# Patient Record
Sex: Female | Born: 1955 | Hispanic: Yes | Marital: Married | Smoking: Never smoker
Health system: Southern US, Community
[De-identification: ages and names within clinical notes are randomized; demographics above are authoritative.]

## PROBLEM LIST (undated history)

## (undated) DIAGNOSIS — K635 Polyp of colon: Secondary | ICD-10-CM

## (undated) DIAGNOSIS — E119 Type 2 diabetes mellitus without complications: Secondary | ICD-10-CM

## (undated) DIAGNOSIS — E785 Hyperlipidemia, unspecified: Secondary | ICD-10-CM

## (undated) DIAGNOSIS — R519 Headache, unspecified: Secondary | ICD-10-CM

## (undated) DIAGNOSIS — F329 Major depressive disorder, single episode, unspecified: Secondary | ICD-10-CM

## (undated) DIAGNOSIS — G4700 Insomnia, unspecified: Secondary | ICD-10-CM

## (undated) DIAGNOSIS — I10 Essential (primary) hypertension: Secondary | ICD-10-CM

## (undated) DIAGNOSIS — K5790 Diverticulosis of intestine, part unspecified, without perforation or abscess without bleeding: Secondary | ICD-10-CM

## (undated) DIAGNOSIS — F32A Depression, unspecified: Secondary | ICD-10-CM

## (undated) HISTORY — PX: TUBAL LIGATION: SHX77

## (undated) HISTORY — PX: ESOPHAGOGASTRODUODENOSCOPY: SHX1529

## (undated) HISTORY — PX: ABDOMINAL SURGERY: SHX537

## (undated) HISTORY — PX: OTHER SURGICAL HISTORY: SHX169

## (undated) HISTORY — PX: COLONOSCOPY: SHX174

---

## 2013-04-07 ENCOUNTER — Ambulatory Visit: Payer: Self-pay | Admitting: Family Medicine

## 2013-04-12 ENCOUNTER — Emergency Department: Payer: Self-pay | Admitting: Internal Medicine

## 2013-04-12 LAB — URINALYSIS, COMPLETE
Bacteria: NONE SEEN
Bilirubin,UR: NEGATIVE
Leukocyte Esterase: NEGATIVE
Nitrite: NEGATIVE
Ph: 8 (ref 4.5–8.0)
Squamous Epithelial: 2
WBC UR: 1 /HPF (ref 0–5)

## 2013-04-12 LAB — COMPREHENSIVE METABOLIC PANEL
Albumin: 3.7 g/dL (ref 3.4–5.0)
Anion Gap: 4 — ABNORMAL LOW (ref 7–16)
Bilirubin,Total: 0.4 mg/dL (ref 0.2–1.0)
Calcium, Total: 8.6 mg/dL (ref 8.5–10.1)
Chloride: 108 mmol/L — ABNORMAL HIGH (ref 98–107)
Creatinine: 0.63 mg/dL (ref 0.60–1.30)
EGFR (African American): >60
Glucose: 100 mg/dL — ABNORMAL HIGH (ref 65–99)
Potassium: 4.2 mmol/L (ref 3.5–5.1)
SGOT(AST): 34 U/L (ref 15–37)
SGPT (ALT): 34 U/L (ref 12–78)
Sodium: 140 mmol/L (ref 136–145)

## 2013-04-12 LAB — CBC
MCH: 28.5 pg (ref 26.0–34.0)
MCHC: 33.5 g/dL (ref 32.0–36.0)
MCV: 85 fL (ref 80–100)
RDW: 13.9 % (ref 11.5–14.5)
WBC: 7.8 10*3/uL (ref 3.6–11.0)

## 2013-04-12 LAB — LIPASE, BLOOD: Lipase: 154 U/L (ref 73–393)

## 2013-05-29 ENCOUNTER — Ambulatory Visit: Payer: Self-pay | Admitting: Gastroenterology

## 2013-06-08 ENCOUNTER — Ambulatory Visit: Payer: Self-pay | Admitting: Gastroenterology

## 2013-06-15 ENCOUNTER — Ambulatory Visit: Payer: Self-pay | Admitting: Gastroenterology

## 2013-06-16 LAB — PATHOLOGY REPORT

## 2013-10-11 ENCOUNTER — Emergency Department: Payer: Self-pay | Admitting: Emergency Medicine

## 2013-10-12 LAB — URINALYSIS, COMPLETE
Bacteria: NONE SEEN
Bilirubin,UR: NEGATIVE
GLUCOSE, UR: NEGATIVE mg/dL (ref 0–75)
Ketone: NEGATIVE
LEUKOCYTE ESTERASE: NEGATIVE
Nitrite: NEGATIVE
PH: 5 (ref 4.5–8.0)
Protein: 30
RBC,UR: 2 /HPF (ref 0–5)
SPECIFIC GRAVITY: 1.025 (ref 1.003–1.030)
Squamous Epithelial: 6
WBC UR: 1 /HPF (ref 0–5)

## 2013-10-12 LAB — CBC WITH DIFFERENTIAL/PLATELET
BASOS PCT: 0.3 %
Basophil #: 0 10*3/uL (ref 0.0–0.1)
EOS PCT: 2.3 %
Eosinophil #: 0.3 10*3/uL (ref 0.0–0.7)
HCT: 46 % (ref 35.0–47.0)
HGB: 15.2 g/dL (ref 12.0–16.0)
Lymphocyte #: 0.9 10*3/uL — ABNORMAL LOW (ref 1.0–3.6)
Lymphocyte %: 7.7 %
MCH: 28.3 pg (ref 26.0–34.0)
MCHC: 33.1 g/dL (ref 32.0–36.0)
MCV: 86 fL (ref 80–100)
MONOS PCT: 5.1 %
Monocyte #: 0.6 x10 3/mm (ref 0.2–0.9)
NEUTROS PCT: 84.6 %
Neutrophil #: 10.1 10*3/uL — ABNORMAL HIGH (ref 1.4–6.5)
Platelet: 236 10*3/uL (ref 150–440)
RBC: 5.36 10*6/uL — ABNORMAL HIGH (ref 3.80–5.20)
RDW: 13.5 % (ref 11.5–14.5)
WBC: 11.9 10*3/uL — AB (ref 3.6–11.0)

## 2013-10-12 LAB — COMPREHENSIVE METABOLIC PANEL
ALBUMIN: 4.3 g/dL (ref 3.4–5.0)
ALT: 74 U/L (ref 12–78)
Alkaline Phosphatase: 96 U/L
Anion Gap: 8 (ref 7–16)
BILIRUBIN TOTAL: 0.7 mg/dL (ref 0.2–1.0)
BUN: 11 mg/dL (ref 7–18)
CHLORIDE: 106 mmol/L (ref 98–107)
CREATININE: 0.7 mg/dL (ref 0.60–1.30)
Calcium, Total: 9.1 mg/dL (ref 8.5–10.1)
Co2: 22 mmol/L (ref 21–32)
GLUCOSE: 119 mg/dL — AB (ref 65–99)
Osmolality: 272 (ref 275–301)
Potassium: 4.2 mmol/L (ref 3.5–5.1)
SGOT(AST): 48 U/L — ABNORMAL HIGH (ref 15–37)
SODIUM: 136 mmol/L (ref 136–145)
TOTAL PROTEIN: 9 g/dL — AB (ref 6.4–8.2)

## 2013-10-12 LAB — TROPONIN I: Troponin-I: <0.02 ng/mL

## 2013-10-12 LAB — LIPASE, BLOOD: LIPASE: 158 U/L (ref 73–393)

## 2013-11-24 ENCOUNTER — Ambulatory Visit: Payer: Self-pay | Admitting: Surgery

## 2014-02-12 ENCOUNTER — Emergency Department: Payer: Self-pay | Admitting: Emergency Medicine

## 2014-02-12 LAB — COMPREHENSIVE METABOLIC PANEL
ALT: 30 U/L (ref 12–78)
ANION GAP: 5 — AB (ref 7–16)
Albumin: 3.4 g/dL (ref 3.4–5.0)
Alkaline Phosphatase: 104 U/L
BUN: 8 mg/dL (ref 7–18)
Bilirubin,Total: 0.3 mg/dL (ref 0.2–1.0)
CALCIUM: 8.1 mg/dL — AB (ref 8.5–10.1)
CHLORIDE: 109 mmol/L — AB (ref 98–107)
CO2: 24 mmol/L (ref 21–32)
CREATININE: 1 mg/dL (ref 0.60–1.30)
EGFR (African American): >60
EGFR (Non-African Amer.): >60
GLUCOSE: 93 mg/dL (ref 65–99)
OSMOLALITY: 274 (ref 275–301)
Potassium: 3.6 mmol/L (ref 3.5–5.1)
SGOT(AST): 34 U/L (ref 15–37)
Sodium: 138 mmol/L (ref 136–145)
Total Protein: 7.5 g/dL (ref 6.4–8.2)

## 2014-02-12 LAB — URINALYSIS, COMPLETE
Bilirubin,UR: NEGATIVE
GLUCOSE, UR: NEGATIVE mg/dL (ref 0–75)
KETONE: NEGATIVE
Leukocyte Esterase: NEGATIVE
Nitrite: NEGATIVE
PH: 6 (ref 4.5–8.0)
Protein: NEGATIVE
SPECIFIC GRAVITY: 1.012 (ref 1.003–1.030)
Squamous Epithelial: <1
WBC UR: 1 /HPF (ref 0–5)

## 2014-02-12 LAB — CBC WITH DIFFERENTIAL/PLATELET
BASOS ABS: 0.1 10*3/uL (ref 0.0–0.1)
BASOS PCT: 0.8 %
Eosinophil #: 0.3 10*3/uL (ref 0.0–0.7)
Eosinophil %: 3.6 %
HCT: 37.7 % (ref 35.0–47.0)
HGB: 12 g/dL (ref 12.0–16.0)
LYMPHS ABS: 2.4 10*3/uL (ref 1.0–3.6)
LYMPHS PCT: 26.8 %
MCH: 27.8 pg (ref 26.0–34.0)
MCHC: 32 g/dL (ref 32.0–36.0)
MCV: 87 fL (ref 80–100)
MONOS PCT: 12.5 %
Monocyte #: 1.1 x10 3/mm — ABNORMAL HIGH (ref 0.2–0.9)
NEUTROS PCT: 56.3 %
Neutrophil #: 5.1 10*3/uL (ref 1.4–6.5)
Platelet: 305 10*3/uL (ref 150–440)
RBC: 4.33 10*6/uL (ref 3.80–5.20)
RDW: 14 % (ref 11.5–14.5)
WBC: 9 10*3/uL (ref 3.6–11.0)

## 2014-02-12 LAB — D-DIMER(ARMC): D-Dimer: 475 ng/ml

## 2014-02-12 LAB — LIPASE, BLOOD: LIPASE: 305 U/L (ref 73–393)

## 2014-03-12 ENCOUNTER — Emergency Department: Payer: Self-pay | Admitting: Emergency Medicine

## 2014-03-12 LAB — URINALYSIS, COMPLETE
BILIRUBIN, UR: NEGATIVE
Bacteria: NONE SEEN
GLUCOSE, UR: NEGATIVE mg/dL (ref 0–75)
Ketone: NEGATIVE
Leukocyte Esterase: NEGATIVE
NITRITE: NEGATIVE
PH: 5 (ref 4.5–8.0)
Protein: NEGATIVE
RBC,UR: 1 /HPF (ref 0–5)
Specific Gravity: 1.011 (ref 1.003–1.030)
WBC UR: 1 /HPF (ref 0–5)

## 2014-03-12 LAB — COMPREHENSIVE METABOLIC PANEL
ALK PHOS: 107 U/L
ALT: 38 U/L
ANION GAP: 8 (ref 7–16)
AST: 38 U/L — AB (ref 15–37)
Albumin: 3.6 g/dL (ref 3.4–5.0)
BUN: 11 mg/dL (ref 7–18)
Bilirubin,Total: 0.3 mg/dL (ref 0.2–1.0)
CALCIUM: 8.2 mg/dL — AB (ref 8.5–10.1)
CREATININE: 1.05 mg/dL (ref 0.60–1.30)
Chloride: 107 mmol/L (ref 98–107)
Co2: 27 mmol/L (ref 21–32)
EGFR (African American): >60
EGFR (Non-African Amer.): 59 — ABNORMAL LOW
Glucose: 138 mg/dL — ABNORMAL HIGH (ref 65–99)
OSMOLALITY: 285 (ref 275–301)
Potassium: 4 mmol/L (ref 3.5–5.1)
Sodium: 142 mmol/L (ref 136–145)
TOTAL PROTEIN: 7.5 g/dL (ref 6.4–8.2)

## 2014-03-12 LAB — CBC WITH DIFFERENTIAL/PLATELET
BASOS ABS: 0.1 10*3/uL (ref 0.0–0.1)
Basophil %: 1.3 %
Eosinophil #: 0.4 10*3/uL (ref 0.0–0.7)
Eosinophil %: 4.8 %
HCT: 38.8 % (ref 35.0–47.0)
HGB: 12.6 g/dL (ref 12.0–16.0)
LYMPHS ABS: 2.6 10*3/uL (ref 1.0–3.6)
Lymphocyte %: 33.4 %
MCH: 28.2 pg (ref 26.0–34.0)
MCHC: 32.4 g/dL (ref 32.0–36.0)
MCV: 87 fL (ref 80–100)
MONOS PCT: 8.8 %
Monocyte #: 0.7 x10 3/mm (ref 0.2–0.9)
NEUTROS PCT: 51.7 %
Neutrophil #: 4.1 10*3/uL (ref 1.4–6.5)
Platelet: 259 10*3/uL (ref 150–440)
RBC: 4.46 10*6/uL (ref 3.80–5.20)
RDW: 13.6 % (ref 11.5–14.5)
WBC: 7.9 10*3/uL (ref 3.6–11.0)

## 2014-03-12 LAB — LIPASE, BLOOD: Lipase: 253 U/L (ref 73–393)

## 2014-04-16 ENCOUNTER — Ambulatory Visit: Payer: Self-pay | Admitting: Physician Assistant

## 2014-11-12 ENCOUNTER — Other Ambulatory Visit: Payer: Self-pay | Admitting: Family Medicine

## 2014-11-12 DIAGNOSIS — Z1231 Encounter for screening mammogram for malignant neoplasm of breast: Secondary | ICD-10-CM

## 2014-12-04 ENCOUNTER — Other Ambulatory Visit: Payer: Self-pay

## 2014-12-04 DIAGNOSIS — R509 Fever, unspecified: Secondary | ICD-10-CM | POA: Insufficient documentation

## 2014-12-04 DIAGNOSIS — R112 Nausea with vomiting, unspecified: Secondary | ICD-10-CM | POA: Diagnosis not present

## 2014-12-04 DIAGNOSIS — E119 Type 2 diabetes mellitus without complications: Secondary | ICD-10-CM | POA: Insufficient documentation

## 2014-12-04 DIAGNOSIS — R1084 Generalized abdominal pain: Secondary | ICD-10-CM | POA: Insufficient documentation

## 2014-12-04 DIAGNOSIS — M791 Myalgia: Secondary | ICD-10-CM | POA: Insufficient documentation

## 2014-12-04 DIAGNOSIS — R51 Headache: Secondary | ICD-10-CM | POA: Insufficient documentation

## 2014-12-04 DIAGNOSIS — R197 Diarrhea, unspecified: Secondary | ICD-10-CM | POA: Insufficient documentation

## 2014-12-04 DIAGNOSIS — I10 Essential (primary) hypertension: Secondary | ICD-10-CM | POA: Diagnosis not present

## 2014-12-04 LAB — CBC WITH DIFFERENTIAL/PLATELET
BASOS ABS: 0.1 10*3/uL (ref 0–0.1)
BASOS PCT: 1 %
Eosinophils Absolute: 0 10*3/uL (ref 0–0.7)
Eosinophils Relative: 0 %
HEMATOCRIT: 43.6 % (ref 35.0–47.0)
HEMOGLOBIN: 14.5 g/dL (ref 12.0–16.0)
LYMPHS ABS: 1.5 10*3/uL (ref 1.0–3.6)
Lymphocytes Relative: 13 %
MCH: 27.9 pg (ref 26.0–34.0)
MCHC: 33.3 g/dL (ref 32.0–36.0)
MCV: 83.6 fL (ref 80.0–100.0)
MONOS PCT: 7 %
Monocytes Absolute: 0.8 10*3/uL (ref 0.2–0.9)
NEUTROS PCT: 79 %
Neutro Abs: 8.5 10*3/uL — ABNORMAL HIGH (ref 1.4–6.5)
Platelets: 225 10*3/uL (ref 150–440)
RBC: 5.22 MIL/uL — AB (ref 3.80–5.20)
RDW: 14 % (ref 11.5–14.5)
WBC: 10.8 10*3/uL (ref 3.6–11.0)

## 2014-12-04 LAB — COMPREHENSIVE METABOLIC PANEL
ALBUMIN: 4.5 g/dL (ref 3.5–5.0)
ALT: 43 U/L (ref 14–54)
AST: 41 U/L (ref 15–41)
Alkaline Phosphatase: 77 U/L (ref 38–126)
Anion gap: 10 (ref 5–15)
BUN: 6 mg/dL (ref 6–20)
CHLORIDE: 101 mmol/L (ref 101–111)
CO2: 22 mmol/L (ref 22–32)
CREATININE: 0.65 mg/dL (ref 0.44–1.00)
Calcium: 8.9 mg/dL (ref 8.9–10.3)
GFR calc non Af Amer: >60 mL/min (ref >60)
Glucose, Bld: 120 mg/dL — ABNORMAL HIGH (ref 65–99)
Potassium: 3.4 mmol/L — ABNORMAL LOW (ref 3.5–5.1)
Sodium: 133 mmol/L — ABNORMAL LOW (ref 135–145)
Total Bilirubin: 0.5 mg/dL (ref 0.3–1.2)
Total Protein: 8.3 g/dL — ABNORMAL HIGH (ref 6.5–8.1)

## 2014-12-04 LAB — TROPONIN I: Troponin I: <0.03 ng/mL (ref <0.031)

## 2014-12-04 MED ORDER — ONDANSETRON 8 MG PO TBDP
ORAL_TABLET | ORAL | Status: AC
  Filled 2014-12-04: qty 1

## 2014-12-04 MED ORDER — ONDANSETRON 8 MG PO TBDP
8.0000 mg | ORAL_TABLET | Freq: Once | ORAL | Status: AC
  Administered 2014-12-04: 8 mg via ORAL

## 2014-12-04 NOTE — ED Notes (Signed)
Pt states 2 days of nausea, vomiting, diarrhea, headache, generalized body aches, fever, abd pain.

## 2014-12-05 ENCOUNTER — Emergency Department
Admission: EM | Admit: 2014-12-05 | Discharge: 2014-12-05 | Disposition: A | Discharge status: Home / Self Care | Payer: Medicare Other | Attending: Emergency Medicine | Admitting: Emergency Medicine

## 2014-12-05 ENCOUNTER — Encounter: Payer: Self-pay | Admitting: Emergency Medicine

## 2014-12-05 DIAGNOSIS — R1084 Generalized abdominal pain: Secondary | ICD-10-CM

## 2014-12-05 DIAGNOSIS — R197 Diarrhea, unspecified: Secondary | ICD-10-CM

## 2014-12-05 DIAGNOSIS — R112 Nausea with vomiting, unspecified: Secondary | ICD-10-CM

## 2014-12-05 HISTORY — DX: Type 2 diabetes mellitus without complications: E11.9

## 2014-12-05 HISTORY — DX: Essential (primary) hypertension: I10

## 2014-12-05 LAB — URINALYSIS COMPLETE WITH MICROSCOPIC (ARMC ONLY)
Bacteria, UA: NONE SEEN
Bilirubin Urine: NEGATIVE
Glucose, UA: NEGATIVE mg/dL
Ketones, ur: NEGATIVE mg/dL
LEUKOCYTES UA: NEGATIVE
NITRITE: NEGATIVE
PH: 6 (ref 5.0–8.0)
Protein, ur: NEGATIVE mg/dL
Specific Gravity, Urine: 1.003 — ABNORMAL LOW (ref 1.005–1.030)

## 2014-12-05 MED ORDER — ACETAMINOPHEN 325 MG PO TABS
ORAL_TABLET | ORAL | Status: AC
  Administered 2014-12-05: 650 mg
  Filled 2014-12-05: qty 2

## 2014-12-05 MED ORDER — ONDANSETRON HCL 4 MG PO TABS: 4.0000 mg | ORAL_TABLET | Freq: Three times a day (TID) | ORAL | Status: AC | PRN

## 2014-12-05 MED ORDER — SODIUM CHLORIDE 0.9 % IV BOLUS (SEPSIS)
1000.0000 mL | INTRAVENOUS | Status: AC
Start: 2014-12-05 — End: 2014-12-05
  Administered 2014-12-05: 1000 mL via INTRAVENOUS

## 2014-12-05 MED ORDER — ONDANSETRON HCL 4 MG/2ML IJ SOLN
4.0000 mg | INTRAMUSCULAR | Status: AC
  Administered 2014-12-05: 4 mg via INTRAVENOUS

## 2014-12-05 MED ORDER — ONDANSETRON HCL 4 MG/2ML IJ SOLN
INTRAMUSCULAR | Status: AC
  Filled 2014-12-05: qty 2

## 2014-12-05 NOTE — Discharge Instructions (Signed)
Dolor abdominal (Abdominal Pain) El dolor puede tener muchas causas. Normalmente la causa del dolor abdominal no es una enfermedad y Teacher, English as a foreign language sin Clinical research associate. Frecuentemente puede controlarse y tratarse en casa. Su mdico le Chartered certified accountant examen fsico y posiblemente solicite anlisis de sangre y radiografas para ayudar a Teacher, adult education la gravedad de su dolor. Sin embargo, en [employer], debe transcurrir ms tiempo antes de que se pueda Pension scheme manager una causa evidente del dolor. Antes de llegar a ese punto, es posible que su mdico no sepa si necesita ms pruebas o un tratamiento ms profundo. INSTRUCCIONES PARA EL CUIDADO EN EL HOGAR  Est atento al dolor para ver si hay cambios. Las siguientes indicaciones ayudarn a Chief Strategy Officer que pueda sentir:  [place] solo medicamentos de venta libre o recetados, segn las indicaciones del mdico.  No tome laxantes a menos que se lo haya indicado su mdico.  Pruebe con Ardelia Mems dieta lquida absoluta (caldo, t o agua) segn se lo indique su mdico. Introduzca gradualmente una dieta normal, segn su tolerancia. SOLICITE ATENCIN MDICA SI:  Tiene dolor abdominal sin explicacin.  Tiene dolor abdominal relacionado con nuseas o diarrea.  Tiene dolor cuando orina o defeca.  Experimenta dolor abdominal que lo despierta de noche.  Tiene dolor abdominal que empeora o mejora cuando come alimentos.  Tiene dolor abdominal que empeora cuando come alimentos grasosos.  Tiene fiebre. SOLICITE ATENCIN MDICA DE INMEDIATO SI:   El dolor no desaparece en un plazo mximo de 2horas.  No deja de (vomitar).  El Social research officer, government se siente solo en partes del abdomen, como el lado derecho o la parte inferior izquierda del abdomen.  Evaca materia fecal sanguinolenta o negra, de aspecto alquitranado. ASEGRESE DE QUE:  Comprende estas instrucciones.  Controlar su afeccin.  Recibir ayuda de inmediato si no mejora o si empeora. Document Released: 07/16/2005  Document Revised: 07/21/2013 [facility] Patient Information 2015 [place]. This information is not intended to replace advice given to you by your health care provider. Make sure you discuss any questions you have with your health care provider.  Nuseas y vmitos (Nausea and Vomiting)  Naseas significa que tiene ganas de vomitar. Elvmitoes un reflejo por el que los contenidos del estmago salen por la boca.  CUIDADOS EN EL HOGAR  Tome los medicamentos como le indic el mdico.  No se esfuerce por comer. Sin embargo, es necesario que tome lquidos.  Si tiene ganas de comer, [place] dieta normal, segn las indicaciones del mdico.  Coma arroz, trigo, patatas, pan, carnes magras, yogur, frutas y vegetales.  Evite los alimentos [employer].  Beba gran cantidad de lquidos para mantener la orina de tono claro o color amarillo plido.  Consulte a su mdico como reponer la prdida de lquidos (rehidratacin). Los signos de prdida de lquidos (deshidratacin) son:  Catering manager sed.  Tiene los labios y la boca secos.  Est mareado.  La orina es [place].  Orina menos que lo normal.  Se siente confundido.  La respiracin o la frecuencia cardaca son rpidas. SOLICITE AYUDA DE INMEDIATO SI:  Observa sangre en su vmito.  La materia fecal (heces)es negra o de color rojo.  Siente un dolor de cabeza muy intenso o el cuello rgido.  Se siente confundido.  Siente dolor muy fuerte en el vientre (abdominal).  Tiene dolor en el pecho o dificultad para respirar.  No ha orinado durante 8 horas.  Tiene la piel fra y pegajosa.  Sigue vomitando despus de 24  48 horas.  Tiene  fiebre. ASEGRESE QUE:  Comprende estas instrucciones.  Controlar la enfermedad.  Puede solicitar ayuda de inmediato si los sntomas no mejoran, o si empeoran. Document Released: 01/03/2010 Document Revised: 10/08/2011 [facility] Patient Information 2015 [place]. This information is  not intended to replace advice given to you by your health care provider. Make sure you discuss any questions you have with your health care provider.  Diarrea  (Diarrhea) La diarrea es la materia fecal (heces) acuosas. Puede hacerlo sentir dbil, cansado, sediento, o con la boca seca (signos de deshidratacin). La materia fecal acuosa es signo de otro problema, generalmente una infeccin. Suele durar 2 o 3 das. Puede durar ms tiempo si es el signo de una enfermedad grave. Cudese segn lo que le indique su mdico.  CUIDADOS EN EL HOGAR   Beba 1 taza (8 onzas) de lquido cada vez que tenga una deposicin acuosa.  No beba los siguientes lquidos:  Los que contengan azcares simples (fructosa, glucosa, galactosa, [place], sacarosa, maltosa).  Bebidas deportivas.  Jugos de fruta.  Productos lcteos enteros.  Gaseosas.  Bebidas con cafena (caf, t, gaseosas) o alcohol.  Puede usar soluciones de rehidratacin oral si su mdico lo autoriza. Usted mismo puede preparar la solucin. Siga esta receta:   -  cucharadita de sal.   cucharadita de bicarbonato de soda.   de cucharadita de sal sustituta que contenga cloruro de potasio.  1  cucharada de azcar.  1 litros (34 oz) de agua.  Evite los siguientes alimentos:  Los que tienen gran contenido de [place], como frutas y verduras.  Frutas secas, semillas y panes y cereales integrales.   Las endulzadas con alcohol de azcar (xylitol, sorbitol, manitol).  Trate de comer los siguientes alimentos:  Los que tienen almidn, como arroz, pan, pasta, cereales bajos en azcar, avena, smola de maz, papas al horno, galletas y panecillos.  Bananas.  Pur de [employer].  Consuma alimentos ricos en probiticos, como yogur y productos lcteos fermentados.  Lvese bien las manos cada vez que tenga una deposicin acuosa.  Slo tome los medicamentos que le haya indicado su mdico.  Tome un bao caliente para ayudar a Transport planner ardor o  dolor que producen las deposiciones aguadas. SOLICITE AYUDA DE INMEDIATO SI:   No puede beber lquidos sin devolver vomitar.  Sigue vomitando.  Tiene sangre en la materia fecal, o es de color negro y de aspecto alquitranado.  No hay emisin de [place] durante 6 a 8 horas o elimina una pequea cantidad de [place].  Usted tiene dolor de estmago (abdominal) que empeora o se mantiene en el mismo lugar (localiza).  Se siente dbil, mareado, confundido o se desmaya.  Siente un dolor de cabeza muy intenso.  La materia fecal acuosa no mejora.  Tiene fiebre o sntomas que persisten durante ms de 2-3 das.  Tiene fiebre y los sntomas empeoran de manera sbita. ASEGRESE DE QUE:   Comprende estas instrucciones.  Controlar su enfermedad.  Solicitar ayuda de inmediato si no mejora o si empeora. Document Released: 07/02/2012 Document Revised: 11/30/2013 [facility] Patient Information 2015 [place]. This information is not intended to replace advice given to you by your health care provider. Make sure you discuss any questions you have with your health care provider.

## 2014-12-05 NOTE — ED Notes (Signed)
MD at bedside. 

## 2014-12-05 NOTE — ED Provider Notes (Signed)
[facility] Emergency Department Provider Note  ____________________________________________  Time seen: Approximately 2:29 AM  I have reviewed the triage vital signs and the nursing notes.   HISTORY  Chief Complaint Emesis; Diarrhea; Abdominal Pain; Fever; Headache; and Generalized Body Aches  The patient speaks Spanish. I offered these to a hospital interpreter, but the patient prefers to speak Spanish directly with me. I made it clear to her that she has the right to have an interpreter at any point.  HPI Patricia Hamilton is a 59 y.o. female with a history of diabetes but does not take medication who presents with several days of persistent nausea, vomiting, diarrhea, and headache. She is also had some subjective fever and generalized body aches, but the vomiting and diarrhea are her primary complaints. The symptoms are intermittent but occur multiple times each day. She last vomited while in the emergency department. She has vague mild to moderate cramping abdominal pain at times. She has not recently started on any new medications and says that no one else in her family is sick. Nothing makes it better and nothing makes it worse.   Past Medical History  Diagnosis Date  . Diabetes mellitus without complication     no meds  . Hypertension     There are no active problems to display for this patient.   History reviewed. No pertinent past surgical history.  No current outpatient prescriptions on file.  Allergies Review of patient's allergies indicates no known allergies.  History reviewed. No pertinent family history.  Social History History  Substance Use Topics  . Smoking status: Never Smoker   . Smokeless tobacco: Not on file  . Alcohol Use: No    Review of Systems Constitutional: Subjective low-grade fever Eyes: No visual changes. ENT: No sore throat. Cardiovascular: Denies chest pain. Respiratory: Denies shortness of  breath. Gastrointestinal: Abdominal pain and nausea/vomiting/diarrhea as described above. Genitourinary: Negative for dysuria. Musculoskeletal: Negative for back pain. Skin: Negative for rash. Neurological: Negative for headaches, focal weakness or numbness.  10-point ROS otherwise negative.  ____________________________________________   PHYSICAL EXAM:  VITAL SIGNS: ED Triage Vitals  Enc Vitals Group     BP 12/04/14 2109 117/66 mmHg     Pulse Rate 12/04/14 2109 98     Resp 12/05/14 0211 15     Temp 12/04/14 2109 100.4 F (38 C)     Temp Source 12/04/14 2109 Oral     SpO2 12/04/14 2109 99 %     Weight 12/04/14 2109 170 lb (77.111 kg)     Height 12/04/14 2109 5\' 4"  (1.626 m)     Head Cir --      Peak Flow --      Pain Score 12/04/14 2110 9     Pain Loc --      Pain Edu? --      Excl. in [place]? --     Constitutional: Alert and oriented. Well appearing and in no acute distress, though she appears uncomfortable. Eyes: Conjunctivae are normal. PERRL. EOMI. Head: Atraumatic. Nose: No congestion/rhinnorhea. Mouth/Throat: Mucous membranes are moist.  Oropharynx non-erythematous. Neck: No stridor.   Cardiovascular: Normal rate, regular rhythm. Grossly normal heart sounds.  Good peripheral circulation. Respiratory: Normal respiratory effort.  No retractions. Lungs CTAB. Gastrointestinal: Soft with mild generalized nonfocal tenderness. No distention. No abdominal bruits. No CVA tenderness. Musculoskeletal: No lower extremity tenderness nor edema.  No joint effusions. Neurologic:  Normal speech and language. No gross focal neurologic deficits are appreciated. Speech  is normal. No gait instability. Skin:  Skin is warm, dry and intact. No rash noted. Psychiatric: Mood and affect are normal. Speech and behavior are normal.  ____________________________________________   LABS (all labs ordered are listed, but only abnormal results are displayed)  Labs Reviewed  CBC WITH  DIFFERENTIAL/PLATELET - Abnormal; Notable for the following:    RBC 5.22 (*)    Neutro Abs 8.5 (*)    All other components within normal limits  COMPREHENSIVE METABOLIC PANEL - Abnormal; Notable for the following:    Sodium 133 (*)    Potassium 3.4 (*)    Glucose, Bld 120 (*)    Total Protein 8.3 (*)    All other components within normal limits  URINALYSIS COMPLETEWITH MICROSCOPIC ([facility])  - Abnormal; Notable for the following:    Color, Urine STRAW (*)    APPearance CLEAR (*)    Specific Gravity, Urine 1.003 (*)    Hgb urine dipstick 2+ (*)    Squamous Epithelial / LPF 0-5 (*)    All other components within normal limits  TROPONIN I   ____________________________________________  EKG   Date: 12/05/2014  Rate: 98  Rhythm: normal sinus rhythm  QRS Axis: normal  Intervals: normal  ST/T Wave abnormalities: normal  Conduction Disutrbances: none  Narrative Interpretation: unremarkable     ____________________________________________  RADIOLOGY  Not indicated ____________________________________________   PROCEDURES  Procedure(s) performed: None  Critical Care performed: No  ____________________________________________   INITIAL IMPRESSION / ASSESSMENT AND PLAN / ED COURSE  Pertinent labs & imaging results that were available during my care of the patient were reviewed by me and considered in my medical decision making (see chart for details).  Her labs are unremarkable and her physical exam is reassuring. Her primary complaints are vomiting and diarrhea, and her history and physical are most suggestive of a viral gastroenteritis. I'm providing a liter of IV fluids given her recent outputs. She is in no acute distress though she does appear somewhat uncomfortable. I discussed my diagnosis with her and advised the need for close outpatient follow-up at [facility]. I anticipate discharge after the fluids after we obtain the results of her urinalysis and we'll  discharge her with Zofran and my usual and customary return precautions. She agrees with the plan at this time.  ----------------------------------------- 4:53 AM on 12/05/2014 -----------------------------------------  The urinalysis was normal. I checked on the patient and she was sleeping comfortably, in no acute distress, and her headache had improved. I gave her my usual customary return precautions and she will follow up with her primary care doctor at the next available appointment. She knows to return to the emergency department if her symptoms worsen. ____________________________________________   FINAL CLINICAL IMPRESSION(S) / ED DIAGNOSES  Final diagnoses:  Nausea, vomiting, and diarrhea  Generalized abdominal pain     Hinda Kehr, MD 12/05/14 [phone]

## 2015-04-18 ENCOUNTER — Ambulatory Visit
Admission: RE | Admit: 2015-04-18 | Discharge: 2015-04-18 | Disposition: A | Discharge status: Home / Self Care | Payer: Medicare Other | Source: Ambulatory Visit | Attending: Family Medicine | Admitting: Family Medicine

## 2015-04-18 ENCOUNTER — Other Ambulatory Visit: Payer: Self-pay | Admitting: Family Medicine

## 2015-04-18 DIAGNOSIS — Z1231 Encounter for screening mammogram for malignant neoplasm of breast: Secondary | ICD-10-CM | POA: Insufficient documentation

## 2015-05-02 ENCOUNTER — Emergency Department: Payer: Medicare Other

## 2015-05-02 ENCOUNTER — Emergency Department
Admission: EM | Admit: 2015-05-02 | Discharge: 2015-05-02 | Disposition: A | Discharge status: Home / Self Care | Payer: Medicare Other | Attending: Emergency Medicine | Admitting: Emergency Medicine

## 2015-05-02 ENCOUNTER — Encounter: Payer: Self-pay | Admitting: Emergency Medicine

## 2015-05-02 DIAGNOSIS — K5730 Diverticulosis of large intestine without perforation or abscess without bleeding: Secondary | ICD-10-CM | POA: Diagnosis not present

## 2015-05-02 DIAGNOSIS — R1030 Lower abdominal pain, unspecified: Secondary | ICD-10-CM

## 2015-05-02 DIAGNOSIS — I10 Essential (primary) hypertension: Secondary | ICD-10-CM | POA: Diagnosis not present

## 2015-05-02 DIAGNOSIS — E119 Type 2 diabetes mellitus without complications: Secondary | ICD-10-CM | POA: Insufficient documentation

## 2015-05-02 HISTORY — DX: Depression, unspecified: F32.A

## 2015-05-02 HISTORY — DX: Major depressive disorder, single episode, unspecified: F32.9

## 2015-05-02 LAB — COMPREHENSIVE METABOLIC PANEL
ALT: 40 U/L (ref 14–54)
AST: 39 U/L (ref 15–41)
Albumin: 4.4 g/dL (ref 3.5–5.0)
Alkaline Phosphatase: 81 U/L (ref 38–126)
Anion gap: 7 (ref 5–15)
BUN: 11 mg/dL (ref 6–20)
CALCIUM: 9.2 mg/dL (ref 8.9–10.3)
CHLORIDE: 104 mmol/L (ref 101–111)
CO2: 26 mmol/L (ref 22–32)
CREATININE: 0.61 mg/dL (ref 0.44–1.00)
GFR calc Af Amer: >60 mL/min (ref >60)
Glucose, Bld: 78 mg/dL (ref 65–99)
Potassium: 3.8 mmol/L (ref 3.5–5.1)
SODIUM: 137 mmol/L (ref 135–145)
Total Bilirubin: 0.8 mg/dL (ref 0.3–1.2)
Total Protein: 8 g/dL (ref 6.5–8.1)

## 2015-05-02 LAB — CBC WITH DIFFERENTIAL/PLATELET
Basophils Absolute: 0.1 10*3/uL (ref 0–0.1)
Basophils Relative: 1 %
Eosinophils Absolute: 0.4 10*3/uL (ref 0–0.7)
Eosinophils Relative: 3 %
HCT: 41.7 % (ref 35.0–47.0)
Hemoglobin: 13.8 g/dL (ref 12.0–16.0)
LYMPHS ABS: 4.2 10*3/uL — AB (ref 1.0–3.6)
LYMPHS PCT: 32 %
MCH: 27.9 pg (ref 26.0–34.0)
MCHC: 33.1 g/dL (ref 32.0–36.0)
MCV: 84.3 fL (ref 80.0–100.0)
Monocytes Absolute: 1.6 10*3/uL — ABNORMAL HIGH (ref 0.2–0.9)
Monocytes Relative: 12 %
Neutro Abs: 6.6 10*3/uL — ABNORMAL HIGH (ref 1.4–6.5)
Neutrophils Relative %: 52 %
Platelets: 281 10*3/uL (ref 150–440)
RBC: 4.95 MIL/uL (ref 3.80–5.20)
RDW: 14 % (ref 11.5–14.5)
WBC: 12.9 10*3/uL — ABNORMAL HIGH (ref 3.6–11.0)

## 2015-05-02 LAB — URINALYSIS COMPLETE WITH MICROSCOPIC (ARMC ONLY)
Bacteria, UA: NONE SEEN
Bilirubin Urine: NEGATIVE
GLUCOSE, UA: NEGATIVE mg/dL
Ketones, ur: NEGATIVE mg/dL
Leukocytes, UA: NEGATIVE
Nitrite: NEGATIVE
PROTEIN: NEGATIVE mg/dL
SPECIFIC GRAVITY, URINE: 1.003 — AB (ref 1.005–1.030)
pH: 6 (ref 5.0–8.0)

## 2015-05-02 LAB — LIPASE, BLOOD: Lipase: 39 U/L (ref 22–51)

## 2015-05-02 MED ORDER — METRONIDAZOLE 500 MG PO TABS
500.0000 mg | ORAL_TABLET | Freq: Once | ORAL | Status: DC
Start: 2015-05-02 — End: 2018-08-18

## 2015-05-02 MED ORDER — METOCLOPRAMIDE HCL 5 MG/ML IJ SOLN
5.0000 mg | Freq: Once | INTRAMUSCULAR | Status: AC
  Administered 2015-05-02: 5 mg via INTRAVENOUS
  Filled 2015-05-02: qty 1
  Filled 2015-05-02: qty 2

## 2015-05-02 MED ORDER — IOHEXOL 300 MG/ML  SOLN
100.0000 mL | Freq: Once | INTRAMUSCULAR | Status: DC | PRN
Start: 2015-05-02 — End: 2015-05-03
  Filled 2015-05-02: qty 100

## 2015-05-02 MED ORDER — METRONIDAZOLE 500 MG PO TABS
500.0000 mg | ORAL_TABLET | Freq: Once | ORAL | Status: AC
  Administered 2015-05-02: 500 mg via ORAL
  Filled 2015-05-02: qty 1

## 2015-05-02 MED ORDER — SODIUM CHLORIDE 0.9 % IV BOLUS (SEPSIS)
1000.0000 mL | Freq: Once | INTRAVENOUS | Status: AC
  Administered 2015-05-02: 1000 mL via INTRAVENOUS

## 2015-05-02 MED ORDER — IOHEXOL 240 MG/ML SOLN
25.0000 mL | Freq: Once | INTRAMUSCULAR | Status: AC | PRN
  Administered 2015-05-02: 25 mL via ORAL

## 2015-05-02 NOTE — ED Provider Notes (Signed)
[facility] Emergency Department Provider Note  ____________________________________________  Time seen: 64  I have reviewed the triage vital signs and the nursing notes.   HISTORY  Chief Complaint Abdominal Pain     HPI Patricia Hamilton is a 59 y.o. female who reports she has had abdominal pain for 3 days. Over the past 2 days she has had some nausea, one episode of vomiting, and some diarrhea. She is not having any nausea or diarrhea today. The pain is primarily in her lower abdomen, in the central area and to the left. No pain on the right or upper abdomen. This is similar to an episode she had in May when she was in the emergency department as well as.   The patient and her daughter deny any prior history of diverticulitis or colitis.  She has not had any fever.   Past Medical History  Diagnosis Date  . Diabetes mellitus without complication (HCC)     no meds  . Hypertension   . Depression     There are no active problems to display for this patient.   History reviewed. No pertinent past surgical history.  Current Outpatient Rx  Name  Route  Sig  Dispense  Refill  . metroNIDAZOLE (FLAGYL) 500 MG tablet   Oral   Take 1 tablet (500 mg total) by mouth once.   14 tablet   1   . ondansetron (ZOFRAN) 4 MG tablet   Oral   Take 1 tablet (4 mg total) by mouth every 8 (eight) hours as needed for nausea or vomiting.   30 tablet   1     Allergies Review of patient's allergies indicates no known allergies.  No family history on file.  Social History Social History  Substance Use Topics  . Smoking status: Never Smoker   . Smokeless tobacco: None  . Alcohol Use: No    Review of Systems  Constitutional: Negative for fever. ENT: Negative for sore throat. Cardiovascular: Negative for chest pain. Respiratory: Negative for shortness of breath. Gastrointestinal: Positive for abdominal pain, vomiting and diarrhea. Genitourinary: Negative for  dysuria. Musculoskeletal: No myalgias or injuries. Skin: Negative for rash. Neurological: Negative for headaches   10-point ROS otherwise negative.  ____________________________________________   PHYSICAL EXAM:  VITAL SIGNS: ED Triage Vitals  Enc Vitals Group     BP 05/02/15 1535 101/70 mmHg     Pulse Rate 05/02/15 1535 64     Resp 05/02/15 1535 18     Temp 05/02/15 1535 97.5 F (36.4 C)     Temp Source 05/02/15 1535 Oral     SpO2 05/02/15 1535 94 %     Weight 05/02/15 1535 178 lb (80.74 kg)     Height 05/02/15 1535 5\' 3"  (1.6 m)     Head Cir --      Peak Flow --      Pain Score 05/02/15 1538 9     Pain Loc --      Pain Edu? --      Excl. in [place]? --     Constitutional:  Alert and oriented. No distress. ENT   Head: Normocephalic and atraumatic.   Nose: No congestion/rhinnorhea.   Mouth/Throat: Mucous membranes are moist. Cardiovascular: Normal rate, regular rhythm, no murmur noted Respiratory:  Normal respiratory effort, no tachypnea.    Breath sounds are clear and equal bilaterally.  Gastrointestinal: Soft. Notable tenderness in the suprapubic and left lower quadrant. No distention.  Back: No muscle spasm,  no tenderness, no CVA tenderness. Musculoskeletal: No deformity noted. Nontender with normal range of motion in all extremities.  No noted edema. Neurologic:  Normal speech and language. No gross focal neurologic deficits are appreciated.  Skin:  Skin is warm, dry. No rash noted. ____________________________________________    LABS (pertinent positives/negatives)  Labs Reviewed  CBC WITH DIFFERENTIAL/PLATELET - Abnormal; Notable for the following:    WBC 12.9 (*)    Neutro Abs 6.6 (*)    Lymphs Abs 4.2 (*)    Monocytes Absolute 1.6 (*)    All other components within normal limits  URINALYSIS COMPLETEWITH MICROSCOPIC ([facility] ONLY) - Abnormal; Notable for the following:    Color, Urine STRAW (*)    APPearance CLEAR (*)    Specific Gravity, Urine  1.003 (*)    Hgb urine dipstick 2+ (*)    Squamous Epithelial / LPF 0-5 (*)    All other components within normal limits  COMPREHENSIVE METABOLIC PANEL  LIPASE, BLOOD    ____________________________________________    RADIOLOGY  CT abdomen and pelvis:  IMPRESSION: 1. No acute abnormality involving the abdomen or pelvis. 2. Diffuse colonic diverticulosis without evidence of acute diverticulitis.  ____________________________________________ ____________________________________________   INITIAL IMPRESSION / ASSESSMENT AND PLAN / ED COURSE  Pertinent labs & imaging results that were available during my care of the patient were reviewed by me and considered in my medical decision making (see chart for details).  Pleasant, interactive, 59 year old female who speaks Spanish. She has signs and symptoms worrisome for diverticulitis. I have discussed this with her and we have agreed to obtain a CT scan today.  I've discussed her case with her using my limited Spanish and through translation with her daughter. I have offered to get an interpreter. The patient declines an interpreter currently, but we will bring the hospital interpreter and after the CT scan to ensure that we have answered all questions.  ----------------------------------------- 8:24 PM on 05/02/2015 -----------------------------------------  CT scan does not show any acute abnormality. She does have diffuse diverticulosis without sign of diverticulitis.  Reassessment of the patient finds her comfortable and in no acute distress.  I will place her on metronidazole due to the diffuse diverticulosis, abdominal pain, and slight elevation in her white blood cell count.  I have discussed her results and her differential diagnosis with her with the hospital interpreter.  ____________________________________________   FINAL CLINICAL IMPRESSION(S) / ED DIAGNOSES  Final diagnoses:  Diverticulosis of large intestine  without hemorrhage  Lower abdominal pain      Ahmed Prima, MD 05/02/15 2036

## 2015-05-02 NOTE — ED Notes (Signed)
X 3 days, denies fevers

## 2015-05-02 NOTE — Discharge Instructions (Signed)
Dolor abdominal °(Abdominal Pain) °El dolor de estómago (abdominal) puede tener muchas causas. La mayoría de las veces, el dolor de estómago no es peligroso. Muchos de estos casos de dolor de estómago pueden controlarse y tratarse en casa. °CUIDADOS EN EL HOGAR  °· No tome medicamentos que lo ayuden a defecar (laxantes), salvo que su médico se lo indique. °· Solo tome los medicamentos que le haya indicado su médico. °· Coma o beba lo que le indique su médico. Su médico le dirá si debe seguir una dieta especial. °SOLICITE AYUDA SI: °· No sabe cuál es la causa del dolor de estómago. °· Tiene dolor de estómago cuando siente ganas de vomitar (náuseas) o tiene colitis (diarrea). °· Tiene dolor durante la micción o la evacuación. °· El dolor de estómago lo despierta de noche. °· Tiene dolor de estómago que empeora o mejora cuando come. °· Tiene dolor de estómago que empeora cuando come alimentos grasosos. °· Tiene fiebre. °SOLICITE AYUDA DE INMEDIATO SI:  °· El dolor no desaparece en un plazo máximo de 2 horas. °· No deja de (vomitar). °· El dolor cambia y se localiza solo en la parte derecha o izquierda del estómago. °· La materia fecal es sanguinolenta o de aspecto alquitranado. °ASEGÚRESE DE QUE:  °· Comprende estas instrucciones. °· Controlará su afección. °· Recibirá ayuda de inmediato si no mejora o si empeora. °Document Released: 10/12/2008 Document Revised: 07/21/2013 °[employer]® Patient Information ©2015 [employer]. This information is not intended to replace advice given to you by your health care provider. Make sure you discuss any questions you have with your health care provider. ° °

## 2015-06-21 ENCOUNTER — Other Ambulatory Visit: Payer: Self-pay | Admitting: Family Medicine

## 2015-06-21 DIAGNOSIS — R928 Other abnormal and inconclusive findings on diagnostic imaging of breast: Secondary | ICD-10-CM

## 2015-06-22 ENCOUNTER — Other Ambulatory Visit: Payer: Self-pay | Admitting: Family Medicine

## 2015-06-22 DIAGNOSIS — R928 Other abnormal and inconclusive findings on diagnostic imaging of breast: Secondary | ICD-10-CM

## 2015-06-27 ENCOUNTER — Other Ambulatory Visit: Payer: Self-pay | Admitting: Obstetrics and Gynecology

## 2015-06-27 DIAGNOSIS — R928 Other abnormal and inconclusive findings on diagnostic imaging of breast: Secondary | ICD-10-CM

## 2015-07-01 ENCOUNTER — Ambulatory Visit
Admission: RE | Admit: 2015-07-01 | Discharge: 2015-07-01 | Disposition: A | Discharge status: Home / Self Care | Payer: Medicare Other | Source: Ambulatory Visit | Attending: Family Medicine | Admitting: Family Medicine

## 2015-07-01 DIAGNOSIS — R928 Other abnormal and inconclusive findings on diagnostic imaging of breast: Secondary | ICD-10-CM

## 2015-07-01 DIAGNOSIS — N6002 Solitary cyst of left breast: Secondary | ICD-10-CM | POA: Diagnosis not present

## 2016-05-28 ENCOUNTER — Other Ambulatory Visit: Payer: Self-pay | Admitting: Family Medicine

## 2016-05-28 DIAGNOSIS — Z1231 Encounter for screening mammogram for malignant neoplasm of breast: Secondary | ICD-10-CM

## 2016-09-04 ENCOUNTER — Encounter: Payer: Self-pay | Admitting: Emergency Medicine

## 2016-09-04 ENCOUNTER — Emergency Department: Payer: Medicare Other

## 2016-09-04 ENCOUNTER — Emergency Department
Admission: EM | Admit: 2016-09-04 | Discharge: 2016-09-05 | Disposition: A | Discharge status: Home / Self Care | Payer: Medicare Other | Attending: Emergency Medicine | Admitting: Emergency Medicine

## 2016-09-04 DIAGNOSIS — I10 Essential (primary) hypertension: Secondary | ICD-10-CM | POA: Diagnosis not present

## 2016-09-04 DIAGNOSIS — R0602 Shortness of breath: Secondary | ICD-10-CM | POA: Diagnosis not present

## 2016-09-04 DIAGNOSIS — Z79899 Other long term (current) drug therapy: Secondary | ICD-10-CM | POA: Insufficient documentation

## 2016-09-04 DIAGNOSIS — R42 Dizziness and giddiness: Secondary | ICD-10-CM | POA: Insufficient documentation

## 2016-09-04 DIAGNOSIS — R0789 Other chest pain: Secondary | ICD-10-CM | POA: Diagnosis not present

## 2016-09-04 DIAGNOSIS — R112 Nausea with vomiting, unspecified: Secondary | ICD-10-CM | POA: Insufficient documentation

## 2016-09-04 DIAGNOSIS — R51 Headache: Secondary | ICD-10-CM | POA: Insufficient documentation

## 2016-09-04 DIAGNOSIS — R079 Chest pain, unspecified: Secondary | ICD-10-CM | POA: Diagnosis present

## 2016-09-04 DIAGNOSIS — E119 Type 2 diabetes mellitus without complications: Secondary | ICD-10-CM | POA: Diagnosis not present

## 2016-09-04 LAB — CBC
HCT: 45.1 % (ref 35.0–47.0)
HEMOGLOBIN: 14.8 g/dL (ref 12.0–16.0)
MCH: 27.8 pg (ref 26.0–34.0)
MCHC: 32.8 g/dL (ref 32.0–36.0)
MCV: 84.8 fL (ref 80.0–100.0)
Platelets: 277 10*3/uL (ref 150–440)
RBC: 5.32 MIL/uL — ABNORMAL HIGH (ref 3.80–5.20)
RDW: 13.8 % (ref 11.5–14.5)
WBC: 12.8 10*3/uL — AB (ref 3.6–11.0)

## 2016-09-04 LAB — BASIC METABOLIC PANEL
ANION GAP: 7 (ref 5–15)
BUN: 13 mg/dL (ref 6–20)
CALCIUM: 9 mg/dL (ref 8.9–10.3)
CO2: 23 mmol/L (ref 22–32)
Chloride: 106 mmol/L (ref 101–111)
Creatinine, Ser: 0.65 mg/dL (ref 0.44–1.00)
GFR calc Af Amer: >60 mL/min (ref >60)
GLUCOSE: 132 mg/dL — AB (ref 65–99)
POTASSIUM: 3.8 mmol/L (ref 3.5–5.1)
SODIUM: 136 mmol/L (ref 135–145)

## 2016-09-04 LAB — URINE DRUG SCREEN, QUALITATIVE (ARMC ONLY)
AMPHETAMINES, UR SCREEN: NOT DETECTED
BENZODIAZEPINE, UR SCRN: NOT DETECTED
Barbiturates, Ur Screen: NOT DETECTED
COCAINE METABOLITE, UR ~~LOC~~: NOT DETECTED
Cannabinoid 50 Ng, Ur ~~LOC~~: NOT DETECTED
MDMA (ECSTASY) UR SCREEN: NOT DETECTED
METHADONE SCREEN, URINE: NOT DETECTED
OPIATE, UR SCREEN: NOT DETECTED
Phencyclidine (PCP) Ur S: NOT DETECTED
Tricyclic, Ur Screen: NOT DETECTED

## 2016-09-04 LAB — URINALYSIS, COMPLETE (UACMP) WITH MICROSCOPIC
Bacteria, UA: NONE SEEN
Bilirubin Urine: NEGATIVE
GLUCOSE, UA: NEGATIVE mg/dL
Ketones, ur: NEGATIVE mg/dL
Leukocytes, UA: NEGATIVE
NITRITE: NEGATIVE
PH: 5 (ref 5.0–8.0)
Protein, ur: 100 mg/dL — AB
SPECIFIC GRAVITY, URINE: 1.015 (ref 1.005–1.030)

## 2016-09-04 LAB — TROPONIN I

## 2016-09-04 NOTE — ED Triage Notes (Signed)
Pt presents to ED via wheelchair with c/o headache and LEFT arm pain x 3 days, tonight she began to feel chest pain, abdominal pain, shortness of breath, and tingling in LEFT arm. Pt daughter reports pt also has facial swelling beginning this evening. Pt speaking in complete sentences, bilateral equal hand grips.

## 2016-09-05 ENCOUNTER — Emergency Department: Payer: Medicare Other

## 2016-09-05 ENCOUNTER — Encounter: Payer: Self-pay | Admitting: Radiology

## 2016-09-05 LAB — FIBRIN DERIVATIVES D-DIMER (ARMC ONLY): FIBRIN DERIVATIVES D-DIMER (ARMC): 1861 — AB (ref 0–499)

## 2016-09-05 LAB — TROPONIN I

## 2016-09-05 MED ORDER — ONDANSETRON 4 MG PO TBDP: 4.0000 mg | ORAL_TABLET | Freq: Three times a day (TID) | ORAL | 0 refills | Status: DC | PRN

## 2016-09-05 MED ORDER — GI COCKTAIL ~~LOC~~
30.0000 mL | Freq: Once | ORAL | Status: AC
  Administered 2016-09-05: 30 mL via ORAL
  Filled 2016-09-05: qty 30

## 2016-09-05 MED ORDER — ASPIRIN 81 MG PO CHEW
324.0000 mg | CHEWABLE_TABLET | Freq: Once | ORAL | Status: AC
  Administered 2016-09-05: 324 mg via ORAL
  Filled 2016-09-05: qty 4

## 2016-09-05 MED ORDER — BUTALBITAL-APAP-CAFFEINE 50-325-40 MG PO TABS
2.0000 | ORAL_TABLET | Freq: Once | ORAL | Status: AC
  Administered 2016-09-05: 2 via ORAL
  Filled 2016-09-05: qty 2

## 2016-09-05 MED ORDER — IOPAMIDOL (ISOVUE-370) INJECTION 76%
75.0000 mL | Freq: Once | INTRAVENOUS | Status: AC | PRN
  Administered 2016-09-05: 75 mL via INTRAVENOUS

## 2016-09-05 MED ORDER — BUTALBITAL-APAP-CAFFEINE 50-325-40 MG PO TABS: 1.0000 | ORAL_TABLET | Freq: Four times a day (QID) | ORAL | 0 refills | Status: AC | PRN

## 2016-09-05 NOTE — ED Provider Notes (Signed)
[facility] Emergency Department Provider Note   ____________________________________________   First MD Initiated Contact with Patient 09/05/16 0014     (approximate)  I have reviewed the triage vital signs and the nursing notes.   HISTORY  Chief Complaint Chest Pain    HPI Patricia Hamilton is a 61 y.o. female who comes into the hospital today with chest pain. It started this afternoon. She reports that she has some left chest and left arm pain with some shortness of breath. The patient did have some vomiting as well as some dizziness. She reports that her face seemed like it was swollen. She reports that she felt as though her heart was beating fast. The patient has never had this pain before. She didn't take anything for her chest pain. The patient reports that she has had a headache but does have a history of migraines. The patient took Mucinex for her headache but it did not help. Currently the patient's pain is a 2 out of 10 in intensity. The patient reports that she still has some pressure in her chest but it is improved. She's had some sweats and feels like her ears are congested. The patient decided to come into the hospital today for evaluation of her symptoms.   Past Medical History:  Diagnosis Date  . Depression   . Diabetes mellitus without complication (HCC)    no meds  . Hypertension     There are no active problems to display for this patient.   History reviewed. No pertinent surgical history.  Prior to Admission medications   Medication Sig Start Date End Date Taking? Authorizing Provider  butalbital-acetaminophen-caffeine (FIORICET, ESGIC) 50-325-40 MG tablet Take 1-2 tablets by mouth every 6 (six) hours as needed for headache. 09/05/16 09/05/17  Loney Hering, MD  metroNIDAZOLE (FLAGYL) 500 MG tablet Take 1 tablet (500 mg total) by mouth once. 05/02/15   Ahmed Prima, MD  ondansetron (ZOFRAN ODT) 4 MG disintegrating tablet Take 1  tablet (4 mg total) by mouth every 8 (eight) hours as needed for nausea or vomiting. 09/05/16   Loney Hering, MD    Allergies Patient has no known allergies.  Family History  Problem Relation Age of Onset  . Breast cancer Neg Hx     Social History Social History  Substance Use Topics  . Smoking status: Never Smoker  . Smokeless tobacco: Never Used  . Alcohol use No    Review of Systems Constitutional: Sweats Eyes: No visual changes. ENT: No sore throat. Cardiovascular:  chest pain. Respiratory:  shortness of breath. Gastrointestinal: Nausea and vomiting with No abdominal pain. No diarrhea.  No constipation. Genitourinary: Negative for dysuria. Musculoskeletal: Negative for back pain. Skin: Negative for rash. Neurological: Headache  10-point ROS otherwise negative.  ____________________________________________   PHYSICAL EXAM:  VITAL SIGNS: ED Triage Vitals  Enc Vitals Group     BP 09/04/16 2054 118/69     Pulse Rate 09/04/16 2054 87     Resp 09/04/16 2054 18     Temp 09/04/16 2054 98.1 F (36.7 C)     Temp Source 09/04/16 2054 Oral     SpO2 09/04/16 2054 97 %     Weight 09/04/16 2054 170 lb (77.1 kg)     Height 09/04/16 2054 5' (1.524 m)     Head Circumference --      Peak Flow --      Pain Score 09/04/16 2053 4     Pain Loc --  Pain Edu? --      Excl. in [place]? --     Constitutional: Alert and oriented. Well appearing and in Mild distress. Eyes: Conjunctivae are normal. PERRL. EOMI. Head: Atraumatic. Nose: No congestion/rhinnorhea. Mouth/Throat: Mucous membranes are moist.  Oropharynx non-erythematous. Cardiovascular: Normal rate, regular rhythm. Grossly normal heart sounds.  Good peripheral circulation. Respiratory: Normal respiratory effort.  No retractions. Lungs CTAB. Gastrointestinal: Soft and nontender. No distention. Positive bowel sounds Musculoskeletal: No lower extremity tenderness nor edema.   Neurologic:  Normal speech and language.    Skin:  Skin is warm, dry and intact.  Psychiatric: Mood and affect are normal.   ____________________________________________   LABS (all labs ordered are listed, but only abnormal results are displayed)  Labs Reviewed  BASIC METABOLIC PANEL - Abnormal; Notable for the following:       Result Value   Glucose, Bld 132 (*)    All other components within normal limits  CBC - Abnormal; Notable for the following:    WBC 12.8 (*)    RBC 5.32 (*)    All other components within normal limits  URINALYSIS, COMPLETE (UACMP) WITH MICROSCOPIC - Abnormal; Notable for the following:    Color, Urine YELLOW (*)    APPearance CLEAR (*)    Hgb urine dipstick MODERATE (*)    Protein, ur 100 (*)    Squamous Epithelial / LPF 0-5 (*)    All other components within normal limits  FIBRIN DERIVATIVES D-DIMER ([facility] ONLY) - Abnormal; Notable for the following:    Fibrin derivatives D-dimer [facility]) 1,861 (*)    All other components within normal limits  TROPONIN I  URINE DRUG SCREEN, QUALITATIVE ([facility] ONLY)  TROPONIN I   ____________________________________________  EKG  ED ECG REPORT I, Loney Hering, the attending physician, personally viewed and interpreted this ECG.   Date: 09/04/2016  EKG Time: 2053  Rate: 90  Rhythm: normal sinus rhythm  Axis: normal  Intervals:none  ST&T Change: none  ____________________________________________  RADIOLOGY  CXR CT head CTA chest ____________________________________________   PROCEDURES  Procedure(s) performed: None  Procedures  Critical Care performed: No  ____________________________________________   INITIAL IMPRESSION / ASSESSMENT AND PLAN / ED COURSE  Pertinent labs & imaging results that were available during my care of the patient were reviewed by me and considered in my medical decision making (see chart for details).  This is a 19-year-old female who comes to the hospital today with some chest pain. The patient was  saying that she had some sweats as well as some pressure chest and some dizziness. She also reports that she did have some pleuritic component to her pain. I did send the patient for a CT of her head given her headache and her chest x-ray. I did send a d-dimer which came back positive. I sent the patient for a CT of her chest was unremarkable. The patient will be discharged home to follow-up with her primary care physician. She did receive some Fioricet for her headache as well as some aspirin and a GI cocktail for her chest pain. The patient's pain at this time is improved and her headache she reports is a 4. I feel that the patient is to follow-up with her doctor for further evaluation bowel give her some nausea medicine as well as some Fioricet for home.  Clinical Course as of Sep 05 748  Wed Sep 05, 2016  0015 No active cardiopulmonary disease DG Chest 2 View [AW]  0015 No acute  intracranial abnormalities. CT Head Wo Contrast [AW]  0321 1. No pulmonary embolus identified. 2. No acute process identified.   CT Angio Chest PE W and/or Wo Contrast [AW]    Clinical Course User Index [AW] Loney Hering, MD     ____________________________________________   FINAL CLINICAL IMPRESSION(S) / ED DIAGNOSES  Final diagnoses:  Chest pain, unspecified type      NEW MEDICATIONS STARTED DURING THIS VISIT:  Discharge Medication List as of 09/05/2016  5:51 AM    START taking these medications   Details  ondansetron (ZOFRAN ODT) 4 MG disintegrating tablet Take 1 tablet (4 mg total) by mouth every 8 (eight) hours as needed for nausea or vomiting., Starting Wed 09/05/2016, Print         Note:  This document was prepared using Dragon voice recognition software and may include unintentional dictation errors.    Loney Hering, MD 09/05/16 [phone]

## 2016-09-05 NOTE — ED Notes (Signed)
Pt discharged to home.  Family member driving.  Discharge instructions reviewed.  Verbalized understanding.  No questions or concerns at this time.  Teach back verified.  Pt in NAD.  No items left in ED.   

## 2017-01-18 ENCOUNTER — Encounter: Payer: Self-pay | Admitting: *Deleted

## 2017-01-21 ENCOUNTER — Ambulatory Visit
Admission: RE | Admit: 2017-01-21 | Discharge: 2017-01-21 | Disposition: A | Discharge status: Home / Self Care | Payer: Medicare Other | Source: Ambulatory Visit | Attending: Gastroenterology | Admitting: Gastroenterology

## 2017-01-21 ENCOUNTER — Ambulatory Visit: Payer: Medicare Other | Admitting: Certified Registered Nurse Anesthetist

## 2017-01-21 ENCOUNTER — Encounter
Admission: RE | Disposition: A | Discharge status: Home / Self Care | Payer: Self-pay | Source: Ambulatory Visit | Attending: Gastroenterology

## 2017-01-21 DIAGNOSIS — F329 Major depressive disorder, single episode, unspecified: Secondary | ICD-10-CM | POA: Diagnosis not present

## 2017-01-21 DIAGNOSIS — I10 Essential (primary) hypertension: Secondary | ICD-10-CM | POA: Diagnosis not present

## 2017-01-21 DIAGNOSIS — E119 Type 2 diabetes mellitus without complications: Secondary | ICD-10-CM | POA: Diagnosis not present

## 2017-01-21 DIAGNOSIS — K5730 Diverticulosis of large intestine without perforation or abscess without bleeding: Secondary | ICD-10-CM | POA: Insufficient documentation

## 2017-01-21 DIAGNOSIS — K648 Other hemorrhoids: Secondary | ICD-10-CM | POA: Diagnosis not present

## 2017-01-21 DIAGNOSIS — D123 Benign neoplasm of transverse colon: Secondary | ICD-10-CM | POA: Insufficient documentation

## 2017-01-21 DIAGNOSIS — Z800 Family history of malignant neoplasm of digestive organs: Secondary | ICD-10-CM | POA: Diagnosis not present

## 2017-01-21 DIAGNOSIS — K625 Hemorrhage of anus and rectum: Secondary | ICD-10-CM | POA: Insufficient documentation

## 2017-01-21 DIAGNOSIS — Z7982 Long term (current) use of aspirin: Secondary | ICD-10-CM | POA: Diagnosis not present

## 2017-01-21 DIAGNOSIS — D124 Benign neoplasm of descending colon: Secondary | ICD-10-CM | POA: Diagnosis not present

## 2017-01-21 HISTORY — PX: COLONOSCOPY WITH PROPOFOL: SHX5780

## 2017-01-21 LAB — GLUCOSE, CAPILLARY: Glucose-Capillary: 84 mg/dL (ref 65–99)

## 2017-01-21 SURGERY — COLONOSCOPY WITH PROPOFOL
Anesthesia: General

## 2017-01-21 MED ORDER — PROPOFOL 10 MG/ML IV BOLUS
INTRAVENOUS | Status: DC | PRN
  Administered 2017-01-21: 20 mg via INTRAVENOUS
  Administered 2017-01-21: 30 mg via INTRAVENOUS
  Administered 2017-01-21: 20 mg via INTRAVENOUS

## 2017-01-21 MED ORDER — PROPOFOL 500 MG/50ML IV EMUL
INTRAVENOUS | Status: DC | PRN
Start: 2017-01-21 — End: 2017-01-21
  Administered 2017-01-21: 140 ug/kg/min via INTRAVENOUS

## 2017-01-21 MED ORDER — SODIUM CHLORIDE 0.9 % IV SOLN: INTRAVENOUS | Status: DC

## 2017-01-21 MED ORDER — SODIUM CHLORIDE 0.9 % IV SOLN
INTRAVENOUS | Status: DC
  Administered 2017-01-21: 13:00:00 via INTRAVENOUS

## 2017-01-21 MED ORDER — MIDAZOLAM HCL 2 MG/2ML IJ SOLN
INTRAMUSCULAR | Status: DC | PRN
  Administered 2017-01-21: 2 mg via INTRAVENOUS

## 2017-01-21 MED ORDER — MIDAZOLAM HCL 2 MG/2ML IJ SOLN
INTRAMUSCULAR | Status: AC
Start: 2017-01-21 — End: 2017-01-21
  Filled 2017-01-21: qty 2

## 2017-01-21 MED ORDER — LIDOCAINE HCL (CARDIAC) 20 MG/ML IV SOLN
INTRAVENOUS | Status: DC | PRN
  Administered 2017-01-21: 50 mg via INTRAVENOUS

## 2017-01-21 MED ORDER — PROPOFOL 500 MG/50ML IV EMUL
INTRAVENOUS | Status: AC
  Filled 2017-01-21: qty 50

## 2017-01-21 NOTE — Anesthesia Procedure Notes (Signed)
Date/Time: 01/21/2017 2:30 PM Performed by: Johnna Acosta Pre-anesthesia Checklist: Patient identified, Emergency Drugs available, Suction available, Patient being monitored and Timeout performed Patient Re-evaluated:Patient Re-evaluated prior to inductionOxygen Delivery Method: Nasal cannula

## 2017-01-21 NOTE — Op Note (Signed)
[facility] Gastroenterology Patient Name: Kateria Cutrona Procedure Date: 01/21/2017 2:19 PM MRN: [id number] Account #: [id number] Date of Birth: [birth year] Admit Type: Outpatient Age: 61 Room: [facility] ENDO ROOM 1 Gender: Female Note Status: Finalized Procedure:            Colonoscopy Indications:          Rectal bleeding, Family history of colon cancer in a                        first-degree relative Providers:            Lollie Sails, MD Medicines:            Monitored Anesthesia Care Complications:        No immediate complications. Procedure:            Pre-Anesthesia Assessment:                       - ASA Grade Assessment: II - A patient with mild                        systemic disease.                       After obtaining informed consent, the colonoscope was                        passed under direct vision. Throughout the procedure,                        the patient's blood pressure, pulse, and oxygen                        saturations were monitored continuously. The Olympus                        PCF-H180AL colonoscope ( S#: [id number] ) was introduced                        through the anus and advanced to the the cecum,                        identified by appendiceal orifice and ileocecal valve.                        The colonoscopy was performed without difficulty. The                        patient tolerated the procedure well. The quality of                        the bowel preparation was good. Findings:      Many small and large-mouthed diverticula were found in the sigmoid colon       and descending colon.      A 3 mm polyp was found in the descending colon. The polyp was sessile.       The polyp was removed with a cold biopsy forceps. Resection and       retrieval were complete.      Non-bleeding internal hemorrhoids were found during digital exam and       during  anoscopy. The hemorrhoids were minimal.      The digital rectal exam was  normal otherwise. Impression:           - Diverticulosis in the sigmoid colon and in the                        descending colon.                       - One 3 mm polyp in the descending colon, removed with                        a cold biopsy forceps. Resected and retrieved.                       - Non-bleeding internal hemorrhoids. Recommendation:       - Discharge patient to home.                       - Await pathology results.                       - Telephone GI clinic for pathology results in 1 week.                       - Use Citrucel one tablespoon PO daily daily. Procedure Code(s):    --- Professional ---                       [phone], Colonoscopy, flexible; with biopsy, single or                        multiple Diagnosis Code(s):    --- Professional ---                       K64.8, Other hemorrhoids                       D12.4, Benign neoplasm of descending colon                       K62.5, Hemorrhage of anus and rectum                       Z80.0, Family history of malignant neoplasm of                        digestive organs                       K57.30, Diverticulosis of large intestine without                        perforation or abscess without bleeding CPT copyright 2016 American Medical Association. All rights reserved. The codes documented in this report are preliminary and upon coder review may  be revised to meet current compliance requirements. Lollie Sails, MD 01/21/2017 2:58:39 PM This report has been signed electronically. Number of Addenda: 0 Note Initiated On: 01/21/2017 2:19 PM Scope Withdrawal Time: 0 hours 10 minutes 49 seconds  Total Procedure Duration: 0 hours 15 minutes 45 seconds       [facility]

## 2017-01-21 NOTE — H&P (Signed)
Outpatient short stay form Pre-procedure 01/21/2017 2:28 PM Lollie Sails MD  Primary Physician: Dr. Josephine Cables  Reason for visit:  Colonoscopy  History of present illness:  Patient is a 61 year old female presenting today as above. She has a history of intermittent rectal bleeding occurring several times a month. She also has a family history of colon cancer primary relative. Her prep well. She takes a daily 81 mg aspirin but has not today. She takes no other aspirin products or blood thinning agents    Current Facility-Administered Medications:  .  0.9 %  sodium chloride infusion, , Intravenous, Continuous, Lollie Sails, MD, Last Rate: 20 mL/hr at 01/21/17 1310 .  0.9 %  sodium chloride infusion, , Intravenous, Continuous, Lollie Sails, MD  Prescriptions Prior to Admission  Medication Sig Dispense Refill Last Dose  . cetirizine (ZYRTEC) 10 MG chewable tablet Chew 10 mg by mouth daily as needed for allergies.     Marland Kitchen lisinopril (PRINIVIL,ZESTRIL) 20 MG tablet Take 20 mg by mouth daily.     Marland Kitchen lubiprostone (AMITIZA) 8 MCG capsule Take 8 mcg by mouth 2 (two) times daily with a meal.     . Milnacipran (SAVELLA) 50 MG TABS tablet Take 50 mg by mouth 2 (two) times daily.     . pantoprazole (PROTONIX) 40 MG tablet Take 40 mg by mouth daily.     . propranolol (INDERAL) 60 MG tablet Take 60 mg by mouth daily.     . sertraline (ZOLOFT) 100 MG tablet Take 100 mg by mouth daily.     . traZODone (DESYREL) 50 MG tablet Take 50 mg by mouth at bedtime as needed for sleep.     . butalbital-acetaminophen-caffeine (FIORICET, ESGIC) 50-325-40 MG tablet Take 1-2 tablets by mouth every 6 (six) hours as needed for headache. 20 tablet 0   . metroNIDAZOLE (FLAGYL) 500 MG tablet Take 1 tablet (500 mg total) by mouth once. (Patient not taking: Reported on 01/21/2017) 14 tablet 1 Completed Course at Unknown time  . ondansetron (ZOFRAN ODT) 4 MG disintegrating tablet Take 1 tablet (4 mg total) by  mouth every 8 (eight) hours as needed for nausea or vomiting. 20 tablet 0      No Known Allergies   Past Medical History:  Diagnosis Date  . Depression   . Diabetes mellitus without complication (HCC)    no meds  . Hypertension     Review of systems:      Physical Exam    Heart and lungs: Regular rate and rhythm without rub or gallop, lungs are bilaterally clear.    HEENT: Normocephalic atraumatic eyes are anicteric    Other:     Pertinant exam for procedure: Soft nontender nondistended bowel sounds positive normoactive.    Planned proceedures: Colonoscopy and indicated procedures. I have discussed the risks benefits and complications of procedures to include not limited to bleeding, infection, perforation and the risk of sedation and the patient wishes to proceed.    Lollie Sails, MD Gastroenterology 01/21/2017  2:28 PM

## 2017-01-21 NOTE — Transfer of Care (Signed)
Immediate Anesthesia Transfer of Care Note  Patient: Patricia Hamilton  Procedure(s) Performed: Procedure(s): COLONOSCOPY WITH PROPOFOL (N/A)  Patient Location: PACU  Anesthesia Type:General  Level of Consciousness: sedated  Airway & Oxygen Therapy: Patient Spontanous Breathing and Patient connected to nasal cannula oxygen  Post-op Assessment: Report given to RN and Post -op Vital signs reviewed and stable  Post vital signs: Reviewed and stable  Last Vitals:  Vitals:   01/21/17 1300 01/21/17 1455  BP: 131/61   Pulse: 69   Temp: (!) 35.7 C (P) 36.1 C    Last Pain:  Vitals:   01/21/17 1455  TempSrc: (P) Tympanic         Complications: No apparent anesthesia complications

## 2017-01-21 NOTE — Anesthesia Preprocedure Evaluation (Signed)
Anesthesia Evaluation  Patient identified by MRN, date of birth, ID band Patient awake    Reviewed: Allergy & Precautions, H&P , NPO status , Patient's Chart, lab work & pertinent test results, reviewed documented beta blocker date and time   Airway Mallampati: II   Neck ROM: full    Dental  (+) Poor Dentition   Pulmonary neg pulmonary ROS,    Pulmonary exam normal        Cardiovascular Exercise Tolerance: Poor hypertension, On Medications negative cardio ROS Normal cardiovascular exam Rhythm:regular Rate:Normal     Neuro/Psych PSYCHIATRIC DISORDERS negative neurological ROS  negative psych ROS   GI/Hepatic negative GI ROS, Neg liver ROS,   Endo/Other  negative endocrine ROSdiabetes  Renal/GU negative Renal ROS  negative genitourinary   Musculoskeletal   Abdominal   Peds  Hematology negative hematology ROS (+)   Anesthesia Other Findings Past Medical History: No date: Depression No date: Diabetes mellitus without complication (HCC)     Comment: no meds No date: Hypertension Past Surgical History: No date: ABDOMINAL SURGERY No date: COLONOSCOPY No date: ESOPHAGOGASTRODUODENOSCOPY No date: Laproscopic Tubal Ligation   Reproductive/Obstetrics negative OB ROS                             Anesthesia Physical Anesthesia Plan  ASA: II  Anesthesia Plan: General   Post-op Pain Management:    Induction:   PONV Risk Score and Plan:   Airway Management Planned:   Additional Equipment:   Intra-op Plan:   Post-operative Plan:   Informed Consent: I have reviewed the patients History and Physical, chart, labs and discussed the procedure including the risks, benefits and alternatives for the proposed anesthesia with the patient or authorized representative who has indicated his/her understanding and acceptance.   Dental Advisory Given  Plan Discussed with: CRNA  Anesthesia Plan  Comments:         Anesthesia Quick Evaluation

## 2017-01-21 NOTE — Anesthesia Post-op Follow-up Note (Cosign Needed)
Anesthesia QCDR form completed.        

## 2017-01-23 LAB — SURGICAL PATHOLOGY

## 2017-01-24 NOTE — Anesthesia Postprocedure Evaluation (Signed)
Anesthesia Post Note  Patient: Patricia Hamilton  Procedure(s) Performed: Procedure(s) (LRB): COLONOSCOPY WITH PROPOFOL (N/A)  Patient location during evaluation: PACU Anesthesia Type: General Level of consciousness: awake and alert Pain management: pain level controlled Vital Signs Assessment: post-procedure vital signs reviewed and stable Respiratory status: spontaneous breathing, nonlabored ventilation, respiratory function stable and patient connected to nasal cannula oxygen Cardiovascular status: blood pressure returned to baseline and stable Postop Assessment: no signs of nausea or vomiting Anesthetic complications: no     Last Vitals:  Vitals:   01/21/17 1300 01/21/17 1455  BP: 131/61 (!) 93/45  Pulse: 69 66  Resp:  17  Temp: (!) 35.7 C 36.1 C    Last Pain:  Vitals:   01/22/17 0806  TempSrc:   PainSc: 0-No pain                 Molli Barrows

## 2017-07-03 ENCOUNTER — Other Ambulatory Visit: Payer: Self-pay | Admitting: Nurse Practitioner

## 2017-07-03 DIAGNOSIS — Z1231 Encounter for screening mammogram for malignant neoplasm of breast: Secondary | ICD-10-CM

## 2017-08-15 ENCOUNTER — Ambulatory Visit
Admission: RE | Admit: 2017-08-15 | Discharge: 2017-08-15 | Disposition: A | Discharge status: Home / Self Care | Payer: Medicare Other | Source: Ambulatory Visit | Attending: Nurse Practitioner | Admitting: Nurse Practitioner

## 2017-08-15 DIAGNOSIS — Z1231 Encounter for screening mammogram for malignant neoplasm of breast: Secondary | ICD-10-CM | POA: Diagnosis not present

## 2017-10-07 ENCOUNTER — Other Ambulatory Visit: Payer: Self-pay

## 2017-10-07 DIAGNOSIS — I10 Essential (primary) hypertension: Secondary | ICD-10-CM | POA: Diagnosis not present

## 2017-10-07 DIAGNOSIS — R51 Headache: Secondary | ICD-10-CM | POA: Insufficient documentation

## 2017-10-07 DIAGNOSIS — L509 Urticaria, unspecified: Secondary | ICD-10-CM | POA: Insufficient documentation

## 2017-10-07 DIAGNOSIS — Z79899 Other long term (current) drug therapy: Secondary | ICD-10-CM | POA: Insufficient documentation

## 2017-10-07 DIAGNOSIS — R21 Rash and other nonspecific skin eruption: Secondary | ICD-10-CM | POA: Diagnosis present

## 2017-10-07 DIAGNOSIS — E119 Type 2 diabetes mellitus without complications: Secondary | ICD-10-CM | POA: Diagnosis not present

## 2017-10-07 NOTE — ED Triage Notes (Signed)
Pt arrives to ED via POV from home with c/o headache x3 days and rash x1 day on scalp and bilateral shoulders. Pt denies chest pain, no SHOB, no abdominal pain. Pt denies visual changes, no trouble speaking or swallowing, no c/o unilateral weakness or numbness. Pt reports taking Aspirin for HA this morning at 8am without relief.

## 2017-10-08 ENCOUNTER — Emergency Department
Admission: EM | Admit: 2017-10-08 | Discharge: 2017-10-08 | Disposition: A | Discharge status: Home / Self Care | Payer: Medicare Other | Attending: Emergency Medicine | Admitting: Emergency Medicine

## 2017-10-08 DIAGNOSIS — L509 Urticaria, unspecified: Secondary | ICD-10-CM | POA: Diagnosis not present

## 2017-10-08 LAB — GLUCOSE, CAPILLARY: Glucose-Capillary: 94 mg/dL (ref 65–99)

## 2017-10-08 MED ORDER — DIPHENHYDRAMINE HCL 50 MG/ML IJ SOLN
50.0000 mg | Freq: Once | INTRAMUSCULAR | Status: AC
  Administered 2017-10-08: 50 mg via INTRAVENOUS
  Filled 2017-10-08: qty 1

## 2017-10-08 MED ORDER — IBUPROFEN 600 MG PO TABS
600.0000 mg | ORAL_TABLET | Freq: Once | ORAL | Status: AC
  Administered 2017-10-08: 600 mg via ORAL
  Filled 2017-10-08: qty 1

## 2017-10-08 MED ORDER — FAMOTIDINE IN NACL 20-0.9 MG/50ML-% IV SOLN
20.0000 mg | Freq: Once | INTRAVENOUS | Status: AC
  Administered 2017-10-08: 20 mg via INTRAVENOUS
  Filled 2017-10-08: qty 50

## 2017-10-08 MED ORDER — METHYLPREDNISOLONE SODIUM SUCC 125 MG IJ SOLR
125.0000 mg | Freq: Once | INTRAMUSCULAR | Status: AC
  Administered 2017-10-08: 125 mg via INTRAVENOUS
  Filled 2017-10-08: qty 2

## 2017-10-08 MED ORDER — PREDNISONE 20 MG PO TABS: 60.0000 mg | ORAL_TABLET | Freq: Every day | ORAL | 0 refills | Status: AC

## 2017-10-08 NOTE — ED Provider Notes (Signed)
[facility] Emergency Department Provider Note    First MD Initiated Contact with Patient 10/08/17 0327     (approximate)  I have reviewed the triage vital signs and the nursing notes.   HISTORY  Chief Complaint Headache and Rash    HPI Patricia Hamilton is a 62 y.o. female presents emergency department with generalized pruritic rash which began today.  Patient denies any new medications.  Patient denies any new food intake.  Patient denies any new detergent.  Patient denies any difficulty breathing or swallowing   Past Medical History:  Diagnosis Date  . Depression   . Diabetes mellitus without complication (HCC)    no meds  . Hypertension     There are no active problems to display for this patient.   Past Surgical History:  Procedure Laterality Date  . ABDOMINAL SURGERY    . COLONOSCOPY    . COLONOSCOPY WITH PROPOFOL N/A 01/21/2017   Procedure: COLONOSCOPY WITH PROPOFOL;  Surgeon: Lollie Sails, MD;  Location: [facility] ENDOSCOPY;  Service: Endoscopy;  Laterality: N/A;  . ESOPHAGOGASTRODUODENOSCOPY    . Laproscopic Tubal Ligation      Prior to Admission medications   Medication Sig Start Date End Date Taking? Authorizing Provider  cetirizine (ZYRTEC) 10 MG chewable tablet Chew 10 mg by mouth daily as needed for allergies.    [provider]  lisinopril (PRINIVIL,ZESTRIL) 20 MG tablet Take 20 mg by mouth daily.    [provider]  lubiprostone (AMITIZA) 8 MCG capsule Take 8 mcg by mouth 2 (two) times daily with a meal.    [provider]  metroNIDAZOLE (FLAGYL) 500 MG tablet Take 1 tablet (500 mg total) by mouth once. Patient not taking: Reported on 01/21/2017 05/02/15   Ahmed Prima, MD  Milnacipran (SAVELLA) 50 MG TABS tablet Take 50 mg by mouth 2 (two) times daily.    [provider]  ondansetron (ZOFRAN ODT) 4 MG disintegrating tablet Take 1 tablet (4 mg total) by mouth every 8 (eight) hours as needed for  nausea or vomiting. 09/05/16   Loney Hering, MD  pantoprazole (PROTONIX) 40 MG tablet Take 40 mg by mouth daily.    [provider]  propranolol (INDERAL) 60 MG tablet Take 60 mg by mouth daily.    [provider]  sertraline (ZOLOFT) 100 MG tablet Take 100 mg by mouth daily.    [provider]  traZODone (DESYREL) 50 MG tablet Take 50 mg by mouth at bedtime as needed for sleep.    [provider]    Allergies No known drug allergies  Family History  Problem Relation Age of Onset  . Breast cancer Neg Hx     Social History Social History   Tobacco Use  . Smoking status: Never Smoker  . Smokeless tobacco: Never Used  Substance Use Topics  . Alcohol use: No  . Drug use: Not on file    Review of Systems Constitutional: No fever/chills Eyes: No visual changes. ENT: No sore throat. Cardiovascular: Denies chest pain. Respiratory: Denies shortness of breath. Gastrointestinal: No abdominal pain.  No nausea, no vomiting.  No diarrhea.  No constipation. Genitourinary: Negative for dysuria. Musculoskeletal: Negative for neck pain.  Negative for back pain. Integumentary: Positive for generalized pruritic rash. Neurological: Negative for headaches, focal weakness or numbness.   ____________________________________________   PHYSICAL EXAM:  VITAL SIGNS: ED Triage Vitals  Enc Vitals Group     BP 10/07/17 2248 123/87  Pulse Rate 10/07/17 2248 80     Resp 10/07/17 2248 17     Temp 10/07/17 2248 97.8 F (36.6 C)     Temp Source 10/07/17 2248 Oral     SpO2 10/07/17 2248 97 %     Weight 10/07/17 2249 77.1 kg (170 lb)     Height 10/07/17 2249 1.524 m (5')     Head Circumference --      Peak Flow --      Pain Score 10/07/17 2249 6     Pain Loc --      Pain Edu? --      Excl. in [place]? --     Constitutional: Alert and oriented. Well appearing and in no acute distress. Eyes: Conjunctivae are normal. Head: Atraumatic. Mouth/Throat:  Mucous membranes are moist. Oropharynx non-erythematous. Neck: No stridor.   Cardiovascular: Normal rate, regular rhythm. Good peripheral circulation. Grossly normal heart sounds. Respiratory: Normal respiratory effort.  No retractions. Lungs CTAB. Gastrointestinal: Soft and nontender. No distention.   Musculoskeletal: No lower extremity tenderness nor edema. No gross deformities of extremities. Neurologic:  Normal speech and language. No gross focal neurologic deficits are appreciated.  Skin: Generalized hives noted   Procedures   ____________________________________________   INITIAL IMPRESSION / ASSESSMENT AND PLAN / ED COURSE  As part of my medical decision making, I reviewed the following data within the electronic MEDICAL RECORD NUMBER  62 year old female presented with above-stated history and physical exam of generalized pruritic hives and headache.  Patient has no focal neurological deficit such  imaging of the brain was not performed.  Regarding the patient's allergic reaction patient was given IV Solu-Medrol and 25 mg Benadryl 50 mg and Pepcid 20 mg.  On reevaluation patient's hive markedly improved ____________________________________________  FINAL CLINICAL IMPRESSION(S) / ED DIAGNOSES  Final diagnoses:  Hives     MEDICATIONS GIVEN DURING THIS VISIT:  Medications  methylPREDNISolone sodium succinate (SOLU-MEDROL) 125 mg/2 mL injection 125 mg (125 mg Intravenous Given 10/08/17 0354)  diphenhydrAMINE (BENADRYL) injection 50 mg (50 mg Intravenous Given 10/08/17 0354)  famotidine (PEPCID) IVPB 20 mg premix (20 mg Intravenous New Bag/Given 10/08/17 0354)     ED Discharge Orders    None       Note:  This document was prepared using Dragon voice recognition software and may include unintentional dictation errors.    Gregor Hams, MD 10/08/17 [phone]

## 2017-10-08 NOTE — ED Notes (Signed)
Pt resting on stretcher with lights off to enhance rest. Eyes closed and even respirations. Pt states her hives and itching are much improved.

## 2017-10-08 NOTE — ED Notes (Signed)
Dr. Brown at the bedside

## 2018-03-14 ENCOUNTER — Encounter: Payer: Self-pay | Admitting: Emergency Medicine

## 2018-03-14 ENCOUNTER — Other Ambulatory Visit: Payer: Self-pay

## 2018-03-14 DIAGNOSIS — M545 Low back pain: Secondary | ICD-10-CM | POA: Diagnosis not present

## 2018-03-14 DIAGNOSIS — Z79899 Other long term (current) drug therapy: Secondary | ICD-10-CM | POA: Diagnosis not present

## 2018-03-14 DIAGNOSIS — I10 Essential (primary) hypertension: Secondary | ICD-10-CM | POA: Insufficient documentation

## 2018-03-14 DIAGNOSIS — R1032 Left lower quadrant pain: Secondary | ICD-10-CM | POA: Diagnosis not present

## 2018-03-14 DIAGNOSIS — E119 Type 2 diabetes mellitus without complications: Secondary | ICD-10-CM | POA: Diagnosis not present

## 2018-03-14 LAB — CBC
HEMATOCRIT: 35.4 % (ref 35.0–47.0)
Hemoglobin: 12.2 g/dL (ref 12.0–16.0)
MCH: 28.2 pg (ref 26.0–34.0)
MCHC: 34.4 g/dL (ref 32.0–36.0)
MCV: 82 fL (ref 80.0–100.0)
PLATELETS: 234 10*3/uL (ref 150–440)
RBC: 4.31 MIL/uL (ref 3.80–5.20)
RDW: 14 % (ref 11.5–14.5)
WBC: 7.4 10*3/uL (ref 3.6–11.0)

## 2018-03-14 MED ORDER — ONDANSETRON 4 MG PO TBDP
4.0000 mg | ORAL_TABLET | Freq: Once | ORAL | Status: AC | PRN
  Administered 2018-03-15: 4 mg via ORAL
  Filled 2018-03-14: qty 1

## 2018-03-14 NOTE — ED Triage Notes (Signed)
Pt presents to ER from home with complaints of lower abdominal pain LLQ radiating to left lower back reports has been having these symptoms for about a week, reports odor to urine unable to describe, reports color of urine is dark in color. Pt talks in complete sentences no distress noted

## 2018-03-15 ENCOUNTER — Emergency Department: Payer: Medicare Other

## 2018-03-15 ENCOUNTER — Emergency Department
Admission: EM | Admit: 2018-03-15 | Discharge: 2018-03-15 | Disposition: A | Discharge status: Home / Self Care | Payer: Medicare Other | Attending: Emergency Medicine | Admitting: Emergency Medicine

## 2018-03-15 DIAGNOSIS — M545 Low back pain, unspecified: Secondary | ICD-10-CM

## 2018-03-15 DIAGNOSIS — R1032 Left lower quadrant pain: Secondary | ICD-10-CM

## 2018-03-15 HISTORY — DX: Hyperlipidemia, unspecified: E78.5

## 2018-03-15 LAB — COMPREHENSIVE METABOLIC PANEL
ALT: 46 U/L — ABNORMAL HIGH (ref 0–44)
ANION GAP: 5 (ref 5–15)
AST: 44 U/L — AB (ref 15–41)
Albumin: 4.1 g/dL (ref 3.5–5.0)
Alkaline Phosphatase: 105 U/L (ref 38–126)
BILIRUBIN TOTAL: 0.6 mg/dL (ref 0.3–1.2)
BUN: 10 mg/dL (ref 8–23)
CHLORIDE: 106 mmol/L (ref 98–111)
CO2: 27 mmol/L (ref 22–32)
Calcium: 8.8 mg/dL — ABNORMAL LOW (ref 8.9–10.3)
Creatinine, Ser: 0.58 mg/dL (ref 0.44–1.00)
GFR calc non Af Amer: >60 mL/min (ref >60)
Glucose, Bld: 136 mg/dL — ABNORMAL HIGH (ref 70–99)
POTASSIUM: 3.8 mmol/L (ref 3.5–5.1)
Sodium: 138 mmol/L (ref 135–145)
TOTAL PROTEIN: 7.1 g/dL (ref 6.5–8.1)

## 2018-03-15 LAB — URINALYSIS, COMPLETE (UACMP) WITH MICROSCOPIC
BILIRUBIN URINE: NEGATIVE
GLUCOSE, UA: NEGATIVE mg/dL
Ketones, ur: NEGATIVE mg/dL
LEUKOCYTES UA: NEGATIVE
NITRITE: NEGATIVE
PH: 6 (ref 5.0–8.0)
PROTEIN: NEGATIVE mg/dL
Specific Gravity, Urine: 1.005 (ref 1.005–1.030)

## 2018-03-15 LAB — LIPASE, BLOOD: LIPASE: 56 U/L — AB (ref 11–51)

## 2018-03-15 MED ORDER — HYDROCODONE-ACETAMINOPHEN 5-325 MG PO TABS
1.0000 | ORAL_TABLET | Freq: Once | ORAL | Status: AC
  Administered 2018-03-15: 1 via ORAL
  Filled 2018-03-15: qty 1

## 2018-03-15 MED ORDER — IBUPROFEN 600 MG PO TABS: 600.0000 mg | ORAL_TABLET | Freq: Three times a day (TID) | ORAL | 0 refills | Status: DC | PRN

## 2018-03-15 MED ORDER — HYDROCODONE-ACETAMINOPHEN 5-325 MG PO TABS: 1.0000 | ORAL_TABLET | Freq: Four times a day (QID) | ORAL | 0 refills | Status: DC | PRN

## 2018-03-15 MED ORDER — KETOROLAC TROMETHAMINE 30 MG/ML IJ SOLN
30.0000 mg | Freq: Once | INTRAMUSCULAR | Status: AC
  Administered 2018-03-15: 30 mg via INTRAMUSCULAR
  Filled 2018-03-15: qty 1

## 2018-03-15 MED ORDER — LIDOCAINE 5 % EX PTCH
1.0000 | MEDICATED_PATCH | Freq: Once | CUTANEOUS | Status: DC
  Administered 2018-03-15: 1 via TRANSDERMAL
  Filled 2018-03-15: qty 1

## 2018-03-15 MED ORDER — LIDOCAINE 5 % EX PTCH: 1.0000 | MEDICATED_PATCH | Freq: Two times a day (BID) | CUTANEOUS | 0 refills | Status: DC

## 2018-03-15 NOTE — Discharge Instructions (Signed)
Fortunately today your blood work and your CT scan were reassuring.  Please take your pain medication as needed for severe symptoms and follow-up with your primary care physician this coming week for recheck.  Return to the emergency department sooner for any new or worsening symptoms such as worsening pain, fevers, chills, if you cannot eat or drink, or for any other issues whatsoever.  It was a pleasure to take care of you today, and thank you for coming to our emergency department.  If you have any questions or concerns before leaving please ask the nurse to grab me and I'm more than happy to go through your aftercare instructions again.  If you were prescribed any opioid pain medication today such as Norco, Vicodin, Percocet, morphine, hydrocodone, or oxycodone please make sure you do not drive when you are taking this medication as it can alter your ability to drive safely.  If you have any concerns once you are home that you are not improving or are in fact getting worse before you can make it to your follow-up appointment, please do not hesitate to call [phone] and come back for further evaluation.  Darel Hong, MD  Results for orders placed or performed during the hospital encounter of 03/15/18  Lipase, blood  Result Value Ref Range   Lipase 56 (H) 11 - 51 U/L  Comprehensive metabolic panel  Result Value Ref Range   Sodium 138 135 - 145 mmol/L   Potassium 3.8 3.5 - 5.1 mmol/L   Chloride 106 98 - 111 mmol/L   CO2 27 22 - 32 mmol/L   Glucose, Bld 136 (H) 70 - 99 mg/dL   BUN 10 8 - 23 mg/dL   Creatinine, Ser 0.58 0.44 - 1.00 mg/dL   Calcium 8.8 (L) 8.9 - 10.3 mg/dL   Total Protein 7.1 6.5 - 8.1 g/dL   Albumin 4.1 3.5 - 5.0 g/dL   AST 44 (H) 15 - 41 U/L   ALT 46 (H) 0 - 44 U/L   Alkaline Phosphatase 105 38 - 126 U/L   Total Bilirubin 0.6 0.3 - 1.2 mg/dL   GFR calc non Af Amer >60 >60 mL/min   GFR calc Af Amer >60 >60 mL/min   Anion gap 5 5 - 15  CBC  Result Value Ref Range   WBC  7.4 3.6 - 11.0 K/uL   RBC 4.31 3.80 - 5.20 MIL/uL   Hemoglobin 12.2 12.0 - 16.0 g/dL   HCT 35.4 35.0 - 47.0 %   MCV 82.0 80.0 - 100.0 fL   MCH 28.2 26.0 - 34.0 pg   MCHC 34.4 32.0 - 36.0 g/dL   RDW 14.0 11.5 - 14.5 %   Platelets 234 150 - 440 K/uL  Urinalysis, Complete w Microscopic  Result Value Ref Range   Color, Urine STRAW (A) YELLOW   APPearance HAZY (A) CLEAR   Specific Gravity, Urine 1.005 1.005 - 1.030   pH 6.0 5.0 - 8.0   Glucose, UA NEGATIVE NEGATIVE mg/dL   Hgb urine dipstick SMALL (A) NEGATIVE   Bilirubin Urine NEGATIVE NEGATIVE   Ketones, ur NEGATIVE NEGATIVE mg/dL   Protein, ur NEGATIVE NEGATIVE mg/dL   Nitrite NEGATIVE NEGATIVE   Leukocytes, UA NEGATIVE NEGATIVE   RBC / HPF 0-5 0 - 5 RBC/hpf   WBC, UA 0-5 0 - 5 WBC/hpf   Bacteria, UA RARE (A) NONE SEEN   Squamous Epithelial / LPF 6-10 0 - 5   Ct Abdomen Pelvis Wo Contrast  Result  Date: 03/15/2018 CLINICAL DATA:  Left lower quadrant abdominal pain radiating to the low back. Symptoms for about a week. Dark urine. EXAM: CT ABDOMEN AND PELVIS WITHOUT CONTRAST TECHNIQUE: Multidetector CT imaging of the abdomen and pelvis was performed following the standard protocol without IV contrast. COMPARISON:  05/02/2015 FINDINGS: Lower chest: Lung bases are clear. Hepatobiliary: No focal liver abnormality is seen. No gallstones, gallbladder wall thickening, or biliary dilatation. Pancreas: Unremarkable. No pancreatic ductal dilatation or surrounding inflammatory changes. Spleen: Normal in size without focal abnormality. Adrenals/Urinary Tract: Adrenal glands are unremarkable. Kidneys are normal, without renal calculi, focal lesion, or hydronephrosis. Bladder is unremarkable. Stomach/Bowel: Stomach, small bowel, and colon are not abnormally distended. No wall thickening or inflammatory changes appreciated. Colonic diverticula without evidence of diverticulitis. Appendix is normal. Vascular/Lymphatic: Minimal calcification of the aorta. No  significant lymphadenopathy. Reproductive: Small calcification on the uterus may represent a fibroid. Uterus and ovaries are not significantly enlarged. Other: No free air or free fluid in the abdomen. Abdominal wall musculature appears intact. Musculoskeletal: No acute or significant osseous findings. IMPRESSION: No acute process demonstrated in the abdomen or pelvis. No renal or ureteral stone or obstruction. Colonic diverticulosis without evidence of diverticulitis. Electronically Signed   By: Lucienne Capers M.D.   On: 03/15/2018 01:22

## 2018-03-15 NOTE — ED Provider Notes (Signed)
[facility] Emergency Department Provider Note  ____________________________________________   First MD Initiated Contact with Patient 03/15/18 [phone]     (approximate)  I have reviewed the triage vital signs and the nursing notes.   HISTORY  Chief Complaint Abdominal Pain   HPI Patricia Hamilton is a 62 y.o. female who comes to the emergency department with left lower quadrant abdominal pain radiating down the back of her left leg for the past week or so.  The pain is gradual onset moderate to severe worse with walking and somewhat improved with rest.  She does have some foul smell to her urine.  No difficulty initiating urination or defecation and normal perianal sensation.   No constipation or diarrhea.  She has had a previous abdominal surgery of a laparoscopic tubal ligation.  Her pain is moderate severity.  Over-the-counter medications have not helped.   Past Medical History:  Diagnosis Date  . Depression   . Diabetes mellitus without complication (HCC)    no meds  . Hyperlipidemia   . Hypertension     There are no active problems to display for this patient.   Past Surgical History:  Procedure Laterality Date  . ABDOMINAL SURGERY    . COLONOSCOPY    . COLONOSCOPY WITH PROPOFOL N/A 01/21/2017   Procedure: COLONOSCOPY WITH PROPOFOL;  Surgeon: Lollie Sails, MD;  Location: [facility] ENDOSCOPY;  Service: Endoscopy;  Laterality: N/A;  . ESOPHAGOGASTRODUODENOSCOPY    . Laproscopic Tubal Ligation      Prior to Admission medications   Medication Sig Start Date End Date Taking? Authorizing Provider  cetirizine (ZYRTEC) 10 MG chewable tablet Chew 10 mg by mouth daily as needed for allergies.    [provider]  HYDROcodone-acetaminophen (NORCO) 5-325 MG tablet Take 1 tablet by mouth every 6 (six) hours as needed for up to 7 doses for severe pain. 03/15/18   Darel Hong, MD  ibuprofen (ADVIL,MOTRIN) 600 MG tablet Take 1 tablet (600 mg total) by  mouth every 8 (eight) hours as needed. 03/15/18   Darel Hong, MD  lidocaine (LIDODERM) 5 % Place 1 patch onto the skin every 12 (twelve) hours. Remove & Discard patch within 12 hours or as directed by MD 03/15/18 03/15/19  Darel Hong, MD  lisinopril (PRINIVIL,ZESTRIL) 20 MG tablet Take 20 mg by mouth daily.    [provider]  lubiprostone (AMITIZA) 8 MCG capsule Take 8 mcg by mouth 2 (two) times daily with a meal.    [provider]  metroNIDAZOLE (FLAGYL) 500 MG tablet Take 1 tablet (500 mg total) by mouth once. Patient not taking: Reported on 01/21/2017 05/02/15   Ahmed Prima, MD  Milnacipran (SAVELLA) 50 MG TABS tablet Take 50 mg by mouth 2 (two) times daily.    [provider]  ondansetron (ZOFRAN ODT) 4 MG disintegrating tablet Take 1 tablet (4 mg total) by mouth every 8 (eight) hours as needed for nausea or vomiting. 09/05/16   Loney Hering, MD  pantoprazole (PROTONIX) 40 MG tablet Take 40 mg by mouth daily.    [provider]  propranolol (INDERAL) 60 MG tablet Take 60 mg by mouth daily.    [provider]  sertraline (ZOLOFT) 100 MG tablet Take 100 mg by mouth daily.    [provider]  traZODone (DESYREL) 50 MG tablet Take 50 mg by mouth at bedtime as needed for sleep.    [provider]    Allergies Patient has no known  allergies.  Family History  Problem Relation Age of Onset  . Breast cancer Neg Hx     Social History Social History   Tobacco Use  . Smoking status: Never Smoker  . Smokeless tobacco: Never Used  Substance Use Topics  . Alcohol use: No  . Drug use: Not on file    Review of Systems Constitutional: No fever/chills Eyes: No visual changes. ENT: No sore throat. Cardiovascular: Denies chest pain. Respiratory: Denies shortness of breath. Gastrointestinal: Positive for abdominal pain.  No nausea, no vomiting.  No diarrhea.  No constipation. Genitourinary: Negative for  dysuria. Musculoskeletal: Positive for back pain. Skin: Negative for rash. Neurological: Negative for headaches, focal weakness or numbness.   ____________________________________________   PHYSICAL EXAM:  VITAL SIGNS: ED Triage Vitals  Enc Vitals Group     BP 03/14/18 2328 139/64     Pulse Rate 03/14/18 2328 73     Resp 03/14/18 2328 20     Temp 03/14/18 2328 98.4 F (36.9 C)     Temp Source 03/14/18 2328 Oral     SpO2 03/14/18 2328 99 %     Weight 03/14/18 2329 178 lb (80.7 kg)     Height 03/14/18 2329 5' (1.524 m)     Head Circumference --      Peak Flow --      Pain Score 03/14/18 2328 10     Pain Loc --      Pain Edu? --      Excl. in [place]? --     Constitutional: Alert and oriented x4 appears obviously uncomfortable nontoxic no diaphoresis Eyes: PERRL EOMI. Head: Atraumatic. Nose: No congestion/rhinnorhea. Mouth/Throat: No trismus Neck: No stridor.   Cardiovascular: Normal rate, regular rhythm. Grossly normal heart sounds.  Good peripheral circulation. Respiratory: Normal respiratory effort.  No retractions. Lungs CTAB and moving good air Gastrointestinal: Soft nondistended somewhat tender left lower quadrant no rebound or guarding no peritonitis no midline back tenderness Musculoskeletal: No lower extremity edema   Neurologic:  Normal speech and language. No gross focal neurologic deficits are appreciated. Skin:  Skin is warm, dry and intact. No rash noted. Psychiatric: Mood and affect are normal. Speech and behavior are normal.    ____________________________________________   DIFFERENTIAL includes but not limited to  Sciatica, diverticulitis, colitis, pelvic inflammatory disease ____________________________________________   LABS (all labs ordered are listed, but only abnormal results are displayed)  Labs Reviewed  LIPASE, BLOOD - Abnormal; Notable for the following components:      Result Value   Lipase 56 (*)    All other components within normal  limits  COMPREHENSIVE METABOLIC PANEL - Abnormal; Notable for the following components:   Glucose, Bld 136 (*)    Calcium 8.8 (*)    AST 44 (*)    ALT 46 (*)    All other components within normal limits  URINALYSIS, COMPLETE (UACMP) WITH MICROSCOPIC - Abnormal; Notable for the following components:   Color, Urine STRAW (*)    APPearance HAZY (*)    Hgb urine dipstick SMALL (*)    Bacteria, UA RARE (*)    All other components within normal limits  CBC    Lab work reviewed by me with no acute disease noted _________________________________________  EKG   ____________________________________________  RADIOLOGY  CT abdomen pelvis reviewed by me with no acute disease noted ____________________________________________   PROCEDURES  Procedure(s) performed: no  Procedures  Critical Care performed: no  ____________________________________________   INITIAL IMPRESSION / ASSESSMENT AND PLAN /  ED COURSE  Pertinent labs & imaging results that were available during my care of the patient were reviewed by me and considered in my medical decision making (see chart for details).   As part of my medical decision making, I reviewed the following data within the [place] History obtained from family if available, nursing notes, old chart and ekg, as well as notes from prior ED visits.  Patient arrives obviously uncomfortable with left lower quadrant pain that seems to be radiating down her left leg.  Given IV Toradol and oral Norco for pain control which did seem to improve her symptoms.  CT scan is fortunately reassuring.  At this point the patient likely has musculoskeletal pain so I will prescribe her Norco ibuprofen and a Lidoderm patch for home.  Strict return precautions have been given to the patient verbalized understanding and agreement the plan.      ____________________________________________   FINAL CLINICAL IMPRESSION(S) / ED DIAGNOSES  Final  diagnoses:  Left lower quadrant pain  Acute left-sided low back pain without sciatica      NEW MEDICATIONS STARTED DURING THIS VISIT:  Discharge Medication List as of 03/15/2018  1:57 AM    START taking these medications   Details  HYDROcodone-acetaminophen (NORCO) 5-325 MG tablet Take 1 tablet by mouth every 6 (six) hours as needed for up to 7 doses for severe pain., Starting Sat 03/15/2018, Print    ibuprofen (ADVIL,MOTRIN) 600 MG tablet Take 1 tablet (600 mg total) by mouth every 8 (eight) hours as needed., Starting Sat 03/15/2018, Print    lidocaine (LIDODERM) 5 % Place 1 patch onto the skin every 12 (twelve) hours. Remove & Discard patch within 12 hours or as directed by MD, Starting Sat 03/15/2018, Until Sun 03/15/2019, Print         Note:  This document was prepared using Dragon voice recognition software and may include unintentional dictation errors.     Darel Hong, MD 03/21/18 1035

## 2018-08-18 ENCOUNTER — Encounter: Payer: Self-pay | Admitting: Emergency Medicine

## 2018-08-18 ENCOUNTER — Emergency Department
Admission: EM | Admit: 2018-08-18 | Discharge: 2018-08-19 | Disposition: A | Discharge status: Other Acute Inpatient Hospital | Payer: Medicare HMO | Attending: Emergency Medicine | Admitting: Emergency Medicine

## 2018-08-18 ENCOUNTER — Other Ambulatory Visit: Payer: Self-pay

## 2018-08-18 DIAGNOSIS — Z79899 Other long term (current) drug therapy: Secondary | ICD-10-CM | POA: Diagnosis not present

## 2018-08-18 DIAGNOSIS — T1491XA Suicide attempt, initial encounter: Secondary | ICD-10-CM

## 2018-08-18 DIAGNOSIS — T466X2A Poisoning by antihyperlipidemic and antiarteriosclerotic drugs, intentional self-harm, initial encounter: Secondary | ICD-10-CM | POA: Insufficient documentation

## 2018-08-18 DIAGNOSIS — T39312A Poisoning by propionic acid derivatives, intentional self-harm, initial encounter: Secondary | ICD-10-CM | POA: Diagnosis not present

## 2018-08-18 DIAGNOSIS — E119 Type 2 diabetes mellitus without complications: Secondary | ICD-10-CM | POA: Insufficient documentation

## 2018-08-18 DIAGNOSIS — T50902A Poisoning by unspecified drugs, medicaments and biological substances, intentional self-harm, initial encounter: Secondary | ICD-10-CM

## 2018-08-18 DIAGNOSIS — T383X2A Poisoning by insulin and oral hypoglycemic [antidiabetic] drugs, intentional self-harm, initial encounter: Secondary | ICD-10-CM | POA: Diagnosis not present

## 2018-08-18 DIAGNOSIS — T50912A Poisoning by multiple unspecified drugs, medicaments and biological substances, intentional self-harm, initial encounter: Secondary | ICD-10-CM | POA: Diagnosis present

## 2018-08-18 DIAGNOSIS — I10 Essential (primary) hypertension: Secondary | ICD-10-CM | POA: Insufficient documentation

## 2018-08-18 DIAGNOSIS — F332 Major depressive disorder, recurrent severe without psychotic features: Secondary | ICD-10-CM | POA: Diagnosis not present

## 2018-08-18 DIAGNOSIS — R45851 Suicidal ideations: Secondary | ICD-10-CM | POA: Diagnosis not present

## 2018-08-18 LAB — ACETAMINOPHEN LEVEL
Acetaminophen (Tylenol), Serum: <10 ug/mL — ABNORMAL LOW (ref 10–30)
Acetaminophen (Tylenol), Serum: <10 ug/mL — ABNORMAL LOW (ref 10–30)

## 2018-08-18 LAB — COMPREHENSIVE METABOLIC PANEL
ALBUMIN: 5 g/dL (ref 3.5–5.0)
ALT: 45 U/L — AB (ref 0–44)
AST: 40 U/L (ref 15–41)
Alkaline Phosphatase: 114 U/L (ref 38–126)
Anion gap: 10 (ref 5–15)
BUN: 16 mg/dL (ref 8–23)
CHLORIDE: 102 mmol/L (ref 98–111)
CO2: 24 mmol/L (ref 22–32)
Calcium: 9.1 mg/dL (ref 8.9–10.3)
Creatinine, Ser: 0.67 mg/dL (ref 0.44–1.00)
GFR calc Af Amer: >60 mL/min (ref >60)
Glucose, Bld: 120 mg/dL — ABNORMAL HIGH (ref 70–99)
Potassium: 3.7 mmol/L (ref 3.5–5.1)
SODIUM: 136 mmol/L (ref 135–145)
Total Bilirubin: 0.8 mg/dL (ref 0.3–1.2)
Total Protein: 8.4 g/dL — ABNORMAL HIGH (ref 6.5–8.1)

## 2018-08-18 LAB — CBC WITH DIFFERENTIAL/PLATELET
ABS IMMATURE GRANULOCYTES: 0.04 10*3/uL (ref 0.00–0.07)
BASOS ABS: 0.1 10*3/uL (ref 0.0–0.1)
BASOS PCT: 1 %
EOS ABS: 0.1 10*3/uL (ref 0.0–0.5)
Eosinophils Relative: 1 %
HCT: 40 % (ref 36.0–46.0)
Hemoglobin: 12.9 g/dL (ref 12.0–15.0)
IMMATURE GRANULOCYTES: 0 %
Lymphocytes Relative: 25 %
Lymphs Abs: 2.5 10*3/uL (ref 0.7–4.0)
MCH: 27 pg (ref 26.0–34.0)
MCHC: 32.3 g/dL (ref 30.0–36.0)
MCV: 83.7 fL (ref 80.0–100.0)
Monocytes Absolute: 0.9 10*3/uL (ref 0.1–1.0)
Monocytes Relative: 9 %
NEUTROS ABS: 6.3 10*3/uL (ref 1.7–7.7)
NEUTROS PCT: 64 %
NRBC: 0 % (ref 0.0–0.2)
PLATELETS: 276 10*3/uL (ref 150–400)
RBC: 4.78 MIL/uL (ref 3.87–5.11)
RDW: 13.8 % (ref 11.5–15.5)
WBC: 9.9 10*3/uL (ref 4.0–10.5)

## 2018-08-18 LAB — BASIC METABOLIC PANEL
Anion gap: 7 (ref 5–15)
BUN: 15 mg/dL (ref 8–23)
CO2: 27 mmol/L (ref 22–32)
Calcium: 8.9 mg/dL (ref 8.9–10.3)
Chloride: 105 mmol/L (ref 98–111)
Creatinine, Ser: 0.67 mg/dL (ref 0.44–1.00)
GFR calc Af Amer: >60 mL/min (ref >60)
GFR calc non Af Amer: >60 mL/min (ref >60)
Glucose, Bld: 118 mg/dL — ABNORMAL HIGH (ref 70–99)
Potassium: 3.7 mmol/L (ref 3.5–5.1)
Sodium: 139 mmol/L (ref 135–145)

## 2018-08-18 LAB — URINE DRUG SCREEN, QUALITATIVE (ARMC ONLY)
AMPHETAMINES, UR SCREEN: NOT DETECTED
BENZODIAZEPINE, UR SCRN: NOT DETECTED
Barbiturates, Ur Screen: NOT DETECTED
Cannabinoid 50 Ng, Ur ~~LOC~~: NOT DETECTED
Cocaine Metabolite,Ur ~~LOC~~: NOT DETECTED
MDMA (ECSTASY) UR SCREEN: NOT DETECTED
Methadone Scn, Ur: NOT DETECTED
OPIATE, UR SCREEN: NOT DETECTED
Phencyclidine (PCP) Ur S: NOT DETECTED
Tricyclic, Ur Screen: NOT DETECTED

## 2018-08-18 LAB — ETHANOL

## 2018-08-18 LAB — LACTIC ACID, PLASMA: Lactic Acid, Venous: 1.3 mmol/L (ref 0.5–1.9)

## 2018-08-18 LAB — GLUCOSE, CAPILLARY: GLUCOSE-CAPILLARY: 116 mg/dL — AB (ref 70–99)

## 2018-08-18 LAB — BRAIN NATRIURETIC PEPTIDE: B NATRIURETIC PEPTIDE 5: 9 pg/mL (ref 0.0–100.0)

## 2018-08-18 LAB — SALICYLATE LEVEL

## 2018-08-18 NOTE — ED Notes (Signed)
Pt up to bedside toilet.  

## 2018-08-18 NOTE — ED Notes (Signed)
EDP [place] verb for continued heart monitoring and CO2. All other monitors removed from room. Pt fully dressed out.

## 2018-08-18 NOTE — ED Notes (Signed)
Pt gave verbal consent to update family over phone.

## 2018-08-18 NOTE — ED Triage Notes (Signed)
Patients daughter states another family member found her at table with pill bottles. Possible ingestion. Patient moans but does not speak. General weakness.

## 2018-08-18 NOTE — ED Triage Notes (Signed)
First Nurse Note:  Arrives with daughter who states patient took "pills' about one hour ago.  Patient has Metformin, Ibuprofen, and Pravastatin.  Patient is upset due to marital stressors and possible infidelity.  Daughter states pills were taken approximately 1 hour PTA

## 2018-08-18 NOTE — ED Notes (Signed)
Pt resting. Family remains at bedside.

## 2018-08-18 NOTE — ED Notes (Signed)
[facility]  INFORMED  RN  [facility]  AND  [employer]  OFFICER PENDING  CONSULT

## 2018-08-18 NOTE — ED Notes (Signed)
Pt sleeping. Easily woken verbally. Pt denies needs.

## 2018-08-18 NOTE — ED Notes (Signed)
Poison control called. Patricia Hamilton will check back in with [place].

## 2018-08-18 NOTE — ED Notes (Signed)
EKG completed

## 2018-08-18 NOTE — ED Notes (Signed)
Poison control suggestions given to [place] in person.

## 2018-08-18 NOTE — ED Notes (Signed)
[place]--son-- updated [phone].

## 2018-08-18 NOTE — ED Notes (Signed)
Pt alert and resting in bed.

## 2018-08-18 NOTE — ED Notes (Signed)
[facility] Medic completing 4min checks on pt.

## 2018-08-18 NOTE — ED Notes (Signed)
Family at bedside. 

## 2018-08-18 NOTE — ED Notes (Signed)
Poison control updated and advising repeat BNP and Tylenol levels.

## 2018-08-18 NOTE — ED Notes (Signed)
Pt's husband's 3 med bottles of ibuprofen/metformin/statin sent home with family.

## 2018-08-18 NOTE — ED Provider Notes (Signed)
[facility] Emergency Department Provider Note  ____________________________________________  Time seen: Approximately 6:35 PM  I have reviewed the triage vital signs and the nursing notes.   HISTORY  Chief Complaint Drug Overdose   HPI Patricia Hamilton is a 63 y.o. female with a history of depression, diabetes diet-controlled, hypertension hyperlipidemia who presents for evaluation after a intentional overdose.  Patient has had a history of depression for very long time.  According to her daughter, patient has found out recently that her husband is cheating on her.  Today she was found by a family member sitting on the table with a 3 bottles of pills open next to her.  Patient reports taking anywhere between 10-15 total pills.  The bottles contained metformin 500 mg extended release, ibuprofen 600 mg, and pravastatin.  Patient continues to endorse active suicidal ideation.  No prior history of suicide attempt.  Her depression has been severe and present for several weeks.  Past Medical History:  Diagnosis Date  . Depression   . Diabetes mellitus without complication (HCC)    no meds  . Hyperlipidemia   . Hypertension     There are no active problems to display for this patient.   Past Surgical History:  Procedure Laterality Date  . ABDOMINAL SURGERY    . COLONOSCOPY    . COLONOSCOPY WITH PROPOFOL N/A 01/21/2017   Procedure: COLONOSCOPY WITH PROPOFOL;  Surgeon: Lollie Sails, MD;  Location: [facility] ENDOSCOPY;  Service: Endoscopy;  Laterality: N/A;  . ESOPHAGOGASTRODUODENOSCOPY    . Laproscopic Tubal Ligation      Prior to Admission medications   Medication Sig Start Date End Date Taking? Authorizing Provider  cetirizine (ZYRTEC) 10 MG chewable tablet Chew 10 mg by mouth daily as needed for allergies.    [provider]  lisinopril (PRINIVIL,ZESTRIL) 20 MG tablet Take 20 mg by mouth daily.    [provider]  lubiprostone (AMITIZA) 8  MCG capsule Take 8 mcg by mouth 2 (two) times daily with a meal.    [provider]  Milnacipran (SAVELLA) 50 MG TABS tablet Take 50 mg by mouth 2 (two) times daily.    [provider]  pantoprazole (PROTONIX) 40 MG tablet Take 40 mg by mouth daily.    [provider]  propranolol (INDERAL) 60 MG tablet Take 60 mg by mouth daily.    [provider]  sertraline (ZOLOFT) 100 MG tablet Take 100 mg by mouth daily.    [provider]  traZODone (DESYREL) 50 MG tablet Take 50 mg by mouth at bedtime as needed for sleep.    [provider]    Allergies Patient has no known allergies.  Family History  Problem Relation Age of Onset  . Breast cancer Neg Hx     Social History Social History   Tobacco Use  . Smoking status: Never Smoker  . Smokeless tobacco: Never Used  Substance Use Topics  . Alcohol use: No  . Drug use: Not on file    Review of Systems  Constitutional: Negative for fever. Eyes: Negative for visual changes. ENT: Negative for sore throat. Neck: No neck pain  Cardiovascular: Negative for chest pain. Respiratory: Negative for shortness of breath. Gastrointestinal: Negative for abdominal pain, vomiting or diarrhea. Genitourinary: Negative for dysuria. Musculoskeletal: Negative for back pain. Skin: Negative for rash. Neurological: Negative for headaches, weakness or numbness. Psych: + depression and SI. No HI  ____________________________________________   PHYSICAL EXAM:  VITAL SIGNS: ED  Triage Vitals  Enc Vitals Group     BP 08/18/18 1752 112/77     Pulse Rate 08/18/18 1752 81     Resp 08/18/18 1752 20     Temp 08/18/18 1752 98.2 F (36.8 C)     Temp Source 08/18/18 1752 Oral     SpO2 08/18/18 1752 100 %     Weight 08/18/18 1753 170 lb (77.1 kg)     Height 08/18/18 1753 5\' 3"  (1.6 m)     Head Circumference --      Peak Flow --      Pain Score --      Pain Loc --      Pain Edu? --      Excl. in [place]?  --     Constitutional: Alert and oriented, crying and extremely depressed.  HEENT:      Head: Normocephalic and atraumatic.         Eyes: Conjunctivae are normal. Sclera is non-icteric.       Mouth/Throat: Mucous membranes are moist.       Neck: Supple with no signs of meningismus. Cardiovascular: Regular rate and rhythm. No murmurs, gallops, or rubs. 2+ symmetrical distal pulses are present in all extremities. No JVD. Respiratory: Normal respiratory effort. Lungs are clear to auscultation bilaterally. No wheezes, crackles, or rhonchi.  Gastrointestinal: Soft, non tender, and non distended with positive bowel sounds. No rebound or guarding. Musculoskeletal: Nontender with normal range of motion in all extremities. No edema, cyanosis, or erythema of extremities. Neurologic: Normal speech and language. Face is symmetric. Moving all extremities. No gross focal neurologic deficits are appreciated. Skin: Skin is warm, dry and intact. No rash noted. Psychiatric: Mood and affect are depressed. Crying. Continues to endorse SI ____________________________________________   LABS (all labs ordered are listed, but only abnormal results are displayed)  Labs Reviewed  COMPREHENSIVE METABOLIC PANEL - Abnormal; Notable for the following components:      Result Value   Glucose, Bld 120 (*)    Total Protein 8.4 (*)    ALT 45 (*)    All other components within normal limits  ACETAMINOPHEN LEVEL - Abnormal; Notable for the following components:   Acetaminophen (Tylenol), Serum <10 (*)    All other components within normal limits  GLUCOSE, CAPILLARY - Abnormal; Notable for the following components:   Glucose-Capillary 116 (*)    All other components within normal limits  BASIC METABOLIC PANEL - Abnormal; Notable for the following components:   Glucose, Bld 118 (*)    All other components within normal limits  ACETAMINOPHEN LEVEL - Abnormal; Notable for the following components:   Acetaminophen  (Tylenol), Serum <10 (*)    All other components within normal limits  CBC WITH DIFFERENTIAL/PLATELET  SALICYLATE LEVEL  URINE DRUG SCREEN, QUALITATIVE ([facility] ONLY)  ETHANOL  LACTIC ACID, PLASMA  BRAIN NATRIURETIC PEPTIDE  CBG MONITORING, ED   ____________________________________________  EKG  ED ECG REPORT I, Rudene Re, the attending physician, personally viewed and interpreted this ECG.  Normal sinus rhythm, rate of 89, normal intervals, normal axis, no ST elevations or depressions.  Normal EKG  ____________________________________________  RADIOLOGY  none  ____________________________________________   PROCEDURES  Procedure(s) performed: None Procedures Critical Care performed:  None ____________________________________________   INITIAL IMPRESSION / ASSESSMENT AND PLAN / ED COURSE  63 y.o. female with a history of depression, diabetes diet-controlled, hypertension hyperlipidemia who presents for evaluation after a intentional overdose in a suicide attempt.  Patient took 10 to 17  pills that include extended release metformin 500 mg, pravastatin, and 600 mg ibuprofen.  Patient is alert and oriented, severely depressed, stable vital signs, normal neurological exam.  Discussed with Casey who recommended monitoring for 6 hours and to check coingestions.  Patient be monitored close.  This time she is maintaining her airway with no significant sedation.  Blood glucose is normal 116.  Labs are pending.  EKG showing no abnormal intervals.    _________________________ 10:47 PM on 08/18/2018 -----------------------------------------  Labs with no acute findings.  Patient remains with normal mental status and normal vitals.  Patient transferred to incoming physician at 11 PM.  Patient to be evaluated by psychiatry after medically cleared.   As part of my medical decision making, I reviewed the following data within the [place]  notes reviewed and incorporated, Labs reviewed , EKG interpreted , Old EKG reviewed, Old chart reviewed, A consult was requested and obtained from this/these consultant(s) Poison control and psychiatry, Notes from prior ED visits and [place] Controlled Substance Database    Pertinent labs & imaging results that were available during my care of the patient were reviewed by me and considered in my medical decision making (see chart for details).    ____________________________________________   FINAL CLINICAL IMPRESSION(S) / ED DIAGNOSES  Final diagnoses:  Suicide attempt ([place])  Intentional drug overdose, initial encounter ([place])  Severe episode of recurrent major depressive disorder, without psychotic features ([place])      NEW MEDICATIONS STARTED DURING THIS VISIT:  ED Discharge Orders    None       Note:  This document was prepared using Dragon voice recognition software and may include unintentional dictation errors.    Alfred Levins, [place], MD 08/18/18 2248

## 2018-08-19 ENCOUNTER — Inpatient Hospital Stay
Admission: AD | Admit: 2018-08-19 | Discharge: 2018-08-21 | DRG: 885 | Disposition: A | Discharge status: Home / Self Care | Payer: Medicare HMO | Source: Intra-hospital | Attending: Psychiatry | Admitting: Psychiatry

## 2018-08-19 DIAGNOSIS — F332 Major depressive disorder, recurrent severe without psychotic features: Principal | ICD-10-CM | POA: Diagnosis present

## 2018-08-19 DIAGNOSIS — G43909 Migraine, unspecified, not intractable, without status migrainosus: Secondary | ICD-10-CM | POA: Diagnosis present

## 2018-08-19 DIAGNOSIS — Z79899 Other long term (current) drug therapy: Secondary | ICD-10-CM | POA: Diagnosis not present

## 2018-08-19 DIAGNOSIS — K219 Gastro-esophageal reflux disease without esophagitis: Secondary | ICD-10-CM | POA: Diagnosis present

## 2018-08-19 DIAGNOSIS — R7303 Prediabetes: Secondary | ICD-10-CM | POA: Insufficient documentation

## 2018-08-19 DIAGNOSIS — K589 Irritable bowel syndrome without diarrhea: Secondary | ICD-10-CM | POA: Insufficient documentation

## 2018-08-19 DIAGNOSIS — R45851 Suicidal ideations: Secondary | ICD-10-CM | POA: Diagnosis present

## 2018-08-19 DIAGNOSIS — Z915 Personal history of self-harm: Secondary | ICD-10-CM | POA: Diagnosis not present

## 2018-08-19 DIAGNOSIS — Z87442 Personal history of urinary calculi: Secondary | ICD-10-CM | POA: Insufficient documentation

## 2018-08-19 DIAGNOSIS — T383X2A Poisoning by insulin and oral hypoglycemic [antidiabetic] drugs, intentional self-harm, initial encounter: Secondary | ICD-10-CM | POA: Diagnosis not present

## 2018-08-19 DIAGNOSIS — K5909 Other constipation: Secondary | ICD-10-CM | POA: Diagnosis present

## 2018-08-19 DIAGNOSIS — I10 Essential (primary) hypertension: Secondary | ICD-10-CM | POA: Insufficient documentation

## 2018-08-19 DIAGNOSIS — Z818 Family history of other mental and behavioral disorders: Secondary | ICD-10-CM

## 2018-08-19 DIAGNOSIS — T50912A Poisoning by multiple unspecified drugs, medicaments and biological substances, intentional self-harm, initial encounter: Secondary | ICD-10-CM | POA: Diagnosis not present

## 2018-08-19 DIAGNOSIS — IMO0002 Reserved for concepts with insufficient information to code with codable children: Secondary | ICD-10-CM | POA: Insufficient documentation

## 2018-08-19 DIAGNOSIS — E119 Type 2 diabetes mellitus without complications: Secondary | ICD-10-CM | POA: Diagnosis present

## 2018-08-19 DIAGNOSIS — E785 Hyperlipidemia, unspecified: Secondary | ICD-10-CM | POA: Diagnosis present

## 2018-08-19 DIAGNOSIS — G43709 Chronic migraine without aura, not intractable, without status migrainosus: Secondary | ICD-10-CM | POA: Insufficient documentation

## 2018-08-19 DIAGNOSIS — Z8659 Personal history of other mental and behavioral disorders: Secondary | ICD-10-CM | POA: Insufficient documentation

## 2018-08-19 DIAGNOSIS — M797 Fibromyalgia: Secondary | ICD-10-CM | POA: Diagnosis present

## 2018-08-19 DIAGNOSIS — Z8669 Personal history of other diseases of the nervous system and sense organs: Secondary | ICD-10-CM | POA: Insufficient documentation

## 2018-08-19 DIAGNOSIS — T1491XA Suicide attempt, initial encounter: Secondary | ICD-10-CM | POA: Diagnosis present

## 2018-08-19 LAB — PREGNANCY, URINE: Preg Test, Ur: NEGATIVE

## 2018-08-19 MED ORDER — PROPRANOLOL HCL 20 MG PO TABS
60.0000 mg | ORAL_TABLET | Freq: Every day | ORAL | Status: DC
  Administered 2018-08-20 – 2018-08-21 (×2): 60 mg via ORAL
  Filled 2018-08-19 (×2): qty 3

## 2018-08-19 MED ORDER — LISINOPRIL 20 MG PO TABS
20.0000 mg | ORAL_TABLET | Freq: Every day | ORAL | Status: DC
  Administered 2018-08-20 – 2018-08-21 (×2): 20 mg via ORAL
  Filled 2018-08-19 (×2): qty 1

## 2018-08-19 MED ORDER — MAGNESIUM HYDROXIDE 400 MG/5ML PO SUSP: 30.0000 mL | Freq: Every day | ORAL | Status: DC | PRN

## 2018-08-19 MED ORDER — TRAZODONE HCL 50 MG PO TABS
50.0000 mg | ORAL_TABLET | Freq: Every evening | ORAL | Status: DC | PRN
  Administered 2018-08-19 – 2018-08-20 (×2): 50 mg via ORAL
  Filled 2018-08-19 (×3): qty 1

## 2018-08-19 MED ORDER — PANTOPRAZOLE SODIUM 40 MG PO TBEC
40.0000 mg | DELAYED_RELEASE_TABLET | Freq: Every day | ORAL | Status: DC
  Administered 2018-08-20 – 2018-08-21 (×2): 40 mg via ORAL
  Filled 2018-08-19 (×3): qty 1

## 2018-08-19 MED ORDER — LORATADINE 10 MG PO TABS
10.0000 mg | ORAL_TABLET | Freq: Every day | ORAL | Status: DC
  Administered 2018-08-20 – 2018-08-21 (×2): 10 mg via ORAL
  Filled 2018-08-19 (×2): qty 1

## 2018-08-19 MED ORDER — SERTRALINE HCL 100 MG PO TABS
100.0000 mg | ORAL_TABLET | Freq: Every day | ORAL | Status: DC
  Administered 2018-08-19 – 2018-08-20 (×2): 100 mg via ORAL
  Filled 2018-08-19 (×2): qty 1

## 2018-08-19 MED ORDER — LUBIPROSTONE 8 MCG PO CAPS
8.0000 ug | ORAL_CAPSULE | Freq: Two times a day (BID) | ORAL | Status: DC
  Administered 2018-08-20 – 2018-08-21 (×3): 8 ug via ORAL
  Filled 2018-08-19 (×4): qty 1

## 2018-08-19 MED ORDER — ACETAMINOPHEN 325 MG PO TABS
650.0000 mg | ORAL_TABLET | Freq: Four times a day (QID) | ORAL | Status: DC | PRN
  Administered 2018-08-19: 650 mg via ORAL
  Filled 2018-08-19: qty 2

## 2018-08-19 MED ORDER — ALUM & MAG HYDROXIDE-SIMETH 200-200-20 MG/5ML PO SUSP: 30.0000 mL | ORAL | Status: DC | PRN

## 2018-08-19 MED ORDER — HYDROXYZINE HCL 50 MG PO TABS
50.0000 mg | ORAL_TABLET | Freq: Four times a day (QID) | ORAL | Status: DC | PRN
  Filled 2018-08-19: qty 1

## 2018-08-19 MED ORDER — MILNACIPRAN HCL 50 MG PO TABS
50.0000 mg | ORAL_TABLET | Freq: Two times a day (BID) | ORAL | Status: DC
  Administered 2018-08-20 – 2018-08-21 (×4): 50 mg via ORAL
  Filled 2018-08-19 (×5): qty 1

## 2018-08-19 NOTE — ED Notes (Signed)
Poison control called to check on patient and closed her case.

## 2018-08-19 NOTE — [facility] Group Notes (Signed)
[facility] Notes:  (Nursing/MHT/Case Management/Adjunct)  Date:  08/19/2018  Time:  10:16 PM  Type of Therapy:  Group Therapy  Participation Level:  Did Not Attend    Barnie Mort 08/19/2018, 10:16 PM

## 2018-08-19 NOTE — ED Notes (Signed)
Spoke to patient and daughter and discussed with them what may happen when patient is admitted to [facility] inpatient unit. Patient was concerned that she would not be able to talk with her husband. Patients daughter kept reiterating to her mother to "focus on yourself and getting better". Patient became tearful and said she wanted her husband to be a part of her this.

## 2018-08-19 NOTE — ED Notes (Signed)
Patient assigned to appropriate care area   Introduced self to pt  Patient oriented to unit/care area: Informed that, for their safety, care areas are designed for safety and visiting and phone hours explained to patient. Patient verbalizes understanding, and verbal contract for safety obtained  Environment secured  

## 2018-08-19 NOTE — BH Assessment (Signed)
Patient is to be admitted to [facility] by Dr. Leverne Humbles.  Attending Physician will be Dr. Weber Cooks.   Patient has been assigned to room 311, by [place] Nurse [place].   Intake Paper Work has been signed and placed on patient chart.  ER staff is aware of the admission:  Lattie Haw, ER Secretary    Dr. Jacqualine Code, ER MD   Donneta Romberg, Patient's Nurse   Butch Penny, Patient Access.

## 2018-08-19 NOTE — ED Notes (Signed)
Patient talking to husband, appropriate and cooperative NAD noted

## 2018-08-19 NOTE — Consult Note (Signed)
[facility] Consult   Reason for Consult:  Suicide attempt by overdose of multiple medications Referring Physician:  Dr. Alfred Levins [name] MRN:  [id number] Principal Diagnosis: Suicide attempt by multiple drug overdose Diagnosis:  Principal Problem:   Suicide attempt by multiple drug overdose Active Problems:   Major depressive disorder, recurrent episode, severe ([place])   Total Time spent with patient: 1 hour  Patient seen and records reviewed. Consultation completed with Spanish-speaking hospital provided interpreter.  Subjective: "I am fine"  HPI:  Patricia Hamilton is a 63 y.o. female patient admitted with a history of depression, who was brought to the emergency department by her daughter after patient informed her that she overdosed on metformin, ibuprofen and pravastatin in a suicide attempt. Patient relates significant family and social stressors starting with her having a fall at work in 2002.  Since that time she is on disability, but only making $250 a month.  She is reliant on her husband of 16 years to help support the household.  Patient has 4 adult children whom all live independently with their families.  Patient states that she helped her husband [place] his children [place] the country and cared for them for 3 years, after which they returned to their mother after she had assisted them in "getting their papers."  Husband's children are 21 years, 16 years and 17 years.  Patient states, "I loved them and cared for them, and I feel like they use me."  Patient further relates that yesterday her husband became intoxicated and left his phone open.  She had a suspicion he might be having an affair and read through his text messages which confirmed this.  After a verbal argument, patient states she overdosed on the medications.  Patient continues to have suicidal thoughts, thinking would have been better if she had died.  Stating she does not want to be a  burden to her husband or her children.  She also was able to relate "since I have been allowed to survive, I feel like I have been given a second chance."  Patient reports that she has been treated for depression at the [facility].  She reports that she has started seeing a therapist for the past year after her husband's children moved.  She reports that her last visit with her therapist was at the end of December 2019.  Patient continues to report depressed mood.  She describes poor sleep and often has difficulty falling asleep and maintaining sleep.  She reports her appetite as good.  She describes decreased energy,  decreased concentration, and anhedonia.  She specifically denies any past history of mania or psychotic symptoms.  Patient denies any suicidal plan or intent at this time, however continues to report passive suicidal thoughts.  She denies homicidal ideation.  Past Psychiatric History: Depression treated with medication and therapy, patient does not know name of medication.  Chart review reveals patient takes Zoloft 100 mg and trazodone 50 mg.  Risk to Self:  Yes Risk to Others:  No Prior Inpatient Therapy:  No Prior Outpatient Therapy:  Yes at [facility] clinic.  Past Medical History:  Past Medical History:  Diagnosis Date  . Depression   . Diabetes mellitus without complication (HCC)    no meds  . Hyperlipidemia   . Hypertension     Past Surgical History:  Procedure Laterality Date  . ABDOMINAL SURGERY    . COLONOSCOPY    . COLONOSCOPY WITH PROPOFOL  N/A 01/21/2017   Procedure: COLONOSCOPY WITH PROPOFOL;  Surgeon: Lollie Sails, MD;  Location: [facility] ENDOSCOPY;  Service: Endoscopy;  Laterality: N/A;  . ESOPHAGOGASTRODUODENOSCOPY    . Laproscopic Tubal Ligation     Family History:  Family History  Problem Relation Age of Onset  . Breast cancer Neg Hx    Family Psychiatric  History: Brother with depression and alcoholism  Social History:   Social History   Substance and Sexual Activity  Alcohol Use No     Social History   Substance and Sexual Activity  Drug Use Not on file    Social History   Socioeconomic History  . Marital status: Married    Spouse name: Not on file  . Number of children: Not on file  . Years of education: Not on file  . Highest education level: Not on file  Occupational History  . Not on file  Social Needs  . Financial resource strain: Not on file  . Food insecurity:    Worry: Not on file    Inability: Not on file  . Transportation needs:    Medical: Not on file    Non-medical: Not on file  Tobacco Use  . Smoking status: Never Smoker  . Smokeless tobacco: Never Used  Substance and Sexual Activity  . Alcohol use: No  . Drug use: Not on file  . Sexual activity: Not on file  Lifestyle  . Physical activity:    Days per week: Not on file    Minutes per session: Not on file  . Stress: Not on file  Relationships  . Social connections:    Talks on phone: Not on file    Gets together: Not on file    Attends religious service: Not on file    Active member of club or organization: Not on file    Attends meetings of clubs or organizations: Not on file    Relationship status: Not on file  Other Topics Concern  . Not on file  Social History Narrative  . Not on file   Additional Social History:    On disability since 2002 after finding snow Lives with husband of 16 years 4 grown children Helps raise husband's children currently ages 58, 77 and 39 all whom have returned to live with their mother.  Patient denies alcohol, tobacco or illicit substance use.  Allergies:  No Known Allergies  Labs:  Results for orders placed or performed during the hospital encounter of 08/18/18 (from the past 48 hour(s))  CBC with Differential/Platelet     Status: None   Collection Time: 08/18/18  6:16 PM  Result Value Ref Range   WBC 9.9 4.0 - 10.5 K/uL   RBC 4.78 3.87 - 5.11 MIL/uL   Hemoglobin  12.9 12.0 - 15.0 g/dL   HCT 40.0 36.0 - 46.0 %   MCV 83.7 80.0 - 100.0 fL   MCH 27.0 26.0 - 34.0 pg   MCHC 32.3 30.0 - 36.0 g/dL   RDW 13.8 11.5 - 15.5 %   Platelets 276 150 - 400 K/uL   nRBC 0.0 0.0 - 0.2 %   Neutrophils Relative % 64 %   Neutro Abs 6.3 1.7 - 7.7 K/uL   Lymphocytes Relative 25 %   Lymphs Abs 2.5 0.7 - 4.0 K/uL   Monocytes Relative 9 %   Monocytes Absolute 0.9 0.1 - 1.0 K/uL   Eosinophils Relative 1 %   Eosinophils Absolute 0.1 0.0 - 0.5 K/uL   Basophils  Relative 1 %   Basophils Absolute 0.1 0.0 - 0.1 K/uL   Immature Granulocytes 0 %   Abs Immature Granulocytes 0.04 0.00 - 0.07 K/uL    Comment: Performed at [facility], [address] [place], [place] [address]  Comprehensive metabolic panel     Status: Abnormal   Collection Time: 08/18/18  6:16 PM  Result Value Ref Range   Sodium 136 135 - 145 mmol/L   Potassium 3.7 3.5 - 5.1 mmol/L   Chloride 102 98 - 111 mmol/L   CO2 24 22 - 32 mmol/L   Glucose, Bld 120 (H) 70 - 99 mg/dL   BUN 16 8 - 23 mg/dL   Creatinine, Ser 0.67 0.44 - 1.00 mg/dL   Calcium 9.1 8.9 - 10.3 mg/dL   Total Protein 8.4 (H) 6.5 - 8.1 g/dL   Albumin 5.0 3.5 - 5.0 g/dL   AST 40 15 - 41 U/L   ALT 45 (H) 0 - 44 U/L   Alkaline Phosphatase 114 38 - 126 U/L   Total Bilirubin 0.8 0.3 - 1.2 mg/dL   GFR calc non Af Amer >60 >60 mL/min   GFR calc Af Amer >60 >60 mL/min   Anion gap 10 5 - 15    Comment: Performed at [facility], [address], [place], [place] [address]  Salicylate level     Status: None   Collection Time: 08/18/18  6:16 PM  Result Value Ref Range   Salicylate Lvl <1.1 2.8 - 30.0 mg/dL    Comment: Performed at [facility], [address] [place], [place] [address]  Acetaminophen level     Status: Abnormal   Collection Time: 08/18/18  6:16 PM  Result Value Ref Range   Acetaminophen (Tylenol), Serum <10 (L) 10 - 30 ug/mL    Comment: (NOTE) Therapeutic concentrations vary significantly. A range  of 10-30 ug/mL  may be an effective concentration for many patients. However, some  are best treated at concentrations outside of this range. Acetaminophen concentrations >150 ug/mL at 4 hours after ingestion  and >50 ug/mL at 12 hours after ingestion are often associated with  toxic reactions. Performed at [facility], [address] [place],  [address]   Urine Drug Screen, Qualitative [facility] only)     Status: None   Collection Time: 08/18/18  6:16 PM  Result Value Ref Range   Tricyclic, Ur Screen NONE DETECTED NONE DETECTED   Amphetamines, Ur Screen NONE DETECTED NONE DETECTED   MDMA (Ecstasy)Ur Screen NONE DETECTED NONE DETECTED   Cocaine Metabolite,Ur [place] NONE DETECTED NONE DETECTED   Opiate, Ur Screen NONE DETECTED NONE DETECTED   Phencyclidine (PCP) Ur S NONE DETECTED NONE DETECTED   Cannabinoid 50 Ng, Ur [place] NONE DETECTED NONE DETECTED   Barbiturates, Ur Screen NONE DETECTED NONE DETECTED   Benzodiazepine, Ur Scrn NONE DETECTED NONE DETECTED   Methadone Scn, Ur NONE DETECTED NONE DETECTED    Comment: (NOTE) Tricyclics + metabolites, urine    Cutoff 1000 ng/mL Amphetamines + metabolites, urine  Cutoff 1000 ng/mL MDMA (Ecstasy), urine              Cutoff 500 ng/mL Cocaine Metabolite, urine          Cutoff 300 ng/mL Opiate + metabolites, urine        Cutoff 300 ng/mL Phencyclidine (PCP), urine         Cutoff 25 ng/mL Cannabinoid, urine  Cutoff 50 ng/mL Barbiturates + metabolites, urine  Cutoff 200 ng/mL Benzodiazepine, urine              Cutoff 200 ng/mL Methadone, urine                   Cutoff 300 ng/mL The urine drug screen provides only a preliminary, unconfirmed analytical test result and should not be used for non-medical purposes. Clinical consideration and professional judgment should be applied to any positive drug screen result due to possible interfering substances. A more specific alternate chemical method must be used in order to  obtain a confirmed analytical result. Gas chromatography / mass spectrometry (GC/MS) is the preferred confirmat ory method. Performed at [facility], [address] [place], [place] [address]   Ethanol     Status: None   Collection Time: 08/18/18  6:16 PM  Result Value Ref Range   Alcohol, Ethyl (B) <10 <10 mg/dL    Comment: (NOTE) Lowest detectable limit for serum alcohol is 10 mg/dL. For medical purposes only. Performed at [facility], [address] [place], [place] [address]   Pregnancy, urine     Status: None   Collection Time: 08/18/18  6:16 PM  Result Value Ref Range   Preg Test, Ur NEGATIVE NEGATIVE    Comment: Performed at [facility], [address] [place], [place] [address]  Glucose, capillary     Status: Abnormal   Collection Time: 08/18/18  6:25 PM  Result Value Ref Range   Glucose-Capillary 116 (H) 70 - 99 mg/dL   Comment 1 Notify RN    Comment 2 Document in Chart   Lactic acid, plasma     Status: None   Collection Time: 08/18/18  6:36 PM  Result Value Ref Range   Lactic Acid, Venous 1.3 0.5 - 1.9 mmol/L    Comment: Performed at [facility], [address], [place], [place] [address]  Brain natriuretic peptide     Status: None   Collection Time: 08/18/18  6:36 PM  Result Value Ref Range   B Natriuretic Peptide 9.0 0.0 - 100.0 pg/mL    Comment: Performed at [facility], [address] [place], [place] [address]  Basic metabolic panel     Status: Abnormal   Collection Time: 08/18/18  9:40 PM  Result Value Ref Range   Sodium 139 135 - 145 mmol/L   Potassium 3.7 3.5 - 5.1 mmol/L   Chloride 105 98 - 111 mmol/L   CO2 27 22 - 32 mmol/L   Glucose, Bld 118 (H) 70 - 99 mg/dL   BUN 15 8 - 23 mg/dL   Creatinine, Ser 0.67 0.44 - 1.00 mg/dL   Calcium 8.9 8.9 - 10.3 mg/dL   GFR calc non Af Amer >60 >60 mL/min   GFR calc Af Amer >60 >60 mL/min   Anion gap 7 5 - 15    Comment: Performed at [facility], [address], [place], [place] [address]  Acetaminophen level     Status: Abnormal   Collection Time: 08/18/18  9:40 PM  Result Value Ref Range   Acetaminophen (Tylenol), Serum <10 (L) 10 - 30 ug/mL    Comment: (NOTE) Therapeutic concentrations vary significantly. A range of 10-30 ug/mL  may be an effective concentration for many patients. However, some  are best treated at concentrations outside of this range. Acetaminophen concentrations >150 ug/mL at 4 hours after ingestion  and >50 ug/mL at 12  hours after ingestion are often associated with  toxic reactions. Performed at [facility], [address] [place], [place] [address]     No current facility-administered medications for this encounter.    Current Outpatient Medications  Medication Sig Dispense Refill  . cetirizine (ZYRTEC) 10 MG chewable tablet Chew 10 mg by mouth daily as needed for allergies.    Marland Kitchen lisinopril (PRINIVIL,ZESTRIL) 20 MG tablet Take 20 mg by mouth daily.    Marland Kitchen lubiprostone (AMITIZA) 8 MCG capsule Take 8 mcg by mouth 2 (two) times daily with a meal.    . Milnacipran (SAVELLA) 50 MG TABS tablet Take 50 mg by mouth 2 (two) times daily.    . pantoprazole (PROTONIX) 40 MG tablet Take 40 mg by mouth daily.    . propranolol (INDERAL) 60 MG tablet Take 60 mg by mouth daily.    . sertraline (ZOLOFT) 100 MG tablet Take 100 mg by mouth daily.    . traZODone (DESYREL) 50 MG tablet Take 50 mg by mouth at bedtime as needed for sleep.      Musculoskeletal: Strength & Muscle Tone: within normal limits Gait & Station: normal Patient leans: N/A  Psychiatric Specialty Exam: Physical Exam  Nursing note and vitals reviewed. Constitutional: She is oriented to Hamilton, place, and time. She appears well-developed and well-nourished. She appears distressed (emotional).  HENT:  Head: Normocephalic and atraumatic.  Eyes: EOM are normal.  Neck: Normal range of motion.  Cardiovascular: Normal rate.   Respiratory: Effort normal.  Musculoskeletal: Normal range of motion.  Neurological: She is alert and oriented to [place], place, and time.    Review of Systems  Constitutional: Negative.   Respiratory: Negative.   Cardiovascular: Negative.   Gastrointestinal: Positive for nausea.  Musculoskeletal: Positive for back pain and joint pain.  Neurological: Negative.   Psychiatric/Behavioral: Positive for depression and suicidal ideas. Negative for hallucinations, memory loss and substance abuse. The patient is nervous/anxious and has insomnia.     Blood pressure 112/70, pulse 80, temperature 98.3 F (36.8 C), resp. rate 18, height 5\' 3"  (1.6 m), weight 77.1 kg, SpO2 95 %.Body mass index is 30.11 kg/m.  General Appearance: Casual and Guarded  Eye Contact:  Fair  Speech:  Clear and Coherent and Normal Rate  Volume:  Normal  Mood:  Depressed  Affect:  Appropriate, Depressed and Tearful  Thought Process:  Linear and Descriptions of Associations: Intact  Orientation:  Full (Time, Place, and Hamilton)  Thought Content:  Logical and Hallucinations: None  Suicidal Thoughts:  Yes.  without intent/plan however status post suicide attempt by overdose  Homicidal Thoughts:  No  Memory:  Good  Judgement:  Poor  Insight:  Limited  Psychomotor Activity:  Increased and Restlessness  Concentration:  Concentration: Good  Recall:  Good  Fund of Knowledge:  Good  Language:  Spanish-speaking, requires interpreter  Akathisia:  No  Handed:  Right  AIMS (if indicated):   na  Assets:  Communication Skills Housing Resilience  ADL's:  Intact  Cognition:  WNL  Sleep:   poor     Treatment Plan Summary: Daily contact with patient to assess and evaluate symptoms and progress in treatment and Plan Restart home medications and defer further management to inpatient treatment team.  Disposition: Recommend psychiatric Inpatient admission when medically cleared. Supportive therapy provided about ongoing  stressors. Continue involuntary commitment.  Lavella Hammock, MD 08/19/2018 1:27 PM

## 2018-08-19 NOTE — ED Notes (Signed)
Interpreter request entered as pt's primary language is spanish.

## 2018-08-19 NOTE — ED Notes (Signed)
Report to include Situation, Background, Assessment, and Recommendations received from Jadeka RN. Patient alert and oriented, warm and dry, in no acute distress. Patient denies SI, HI, AVH and pain. Patient made aware of Q15 minute rounds and security cameras for their safety. Patient instructed to come to me with needs or concerns. 

## 2018-08-19 NOTE — ED Notes (Signed)
ivc  Pending  Going  To  beh  med
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
## 2018-08-19 NOTE — ED Notes (Signed)
Pt given breakfast tray. Pt ambulated to bathroom unassisted

## 2018-08-19 NOTE — ED Notes (Signed)
Pt continues to sleep.

## 2018-08-20 DIAGNOSIS — F332 Major depressive disorder, recurrent severe without psychotic features: Principal | ICD-10-CM

## 2018-08-20 LAB — TSH: TSH: 2.593 u[IU]/mL (ref 0.350–4.500)

## 2018-08-20 MED ORDER — LUBIPROSTONE 8 MCG PO CAPS: 8.0000 ug | ORAL_CAPSULE | Freq: Two times a day (BID) | ORAL | 0 refills | Status: DC

## 2018-08-20 MED ORDER — SERTRALINE HCL 100 MG PO TABS: 150.0000 mg | ORAL_TABLET | Freq: Every day | ORAL | 1 refills | Status: DC

## 2018-08-20 MED ORDER — MILNACIPRAN HCL 50 MG PO TABS: 50.0000 mg | ORAL_TABLET | Freq: Two times a day (BID) | ORAL | 0 refills | Status: DC

## 2018-08-20 MED ORDER — TRAZODONE HCL 50 MG PO TABS: 50.0000 mg | ORAL_TABLET | Freq: Every evening | ORAL | 0 refills | Status: DC | PRN

## 2018-08-20 MED ORDER — LISINOPRIL 20 MG PO TABS: 20.0000 mg | ORAL_TABLET | Freq: Every day | ORAL | 0 refills | Status: DC

## 2018-08-20 MED ORDER — PANTOPRAZOLE SODIUM 40 MG PO TBEC: 40.0000 mg | DELAYED_RELEASE_TABLET | Freq: Every day | ORAL | 0 refills | Status: DC

## 2018-08-20 MED ORDER — SERTRALINE HCL 25 MG PO TABS
150.0000 mg | ORAL_TABLET | Freq: Every day | ORAL | Status: DC
  Administered 2018-08-21: 150 mg via ORAL
  Filled 2018-08-20: qty 2

## 2018-08-20 MED ORDER — PROPRANOLOL HCL 60 MG PO TABS: 60.0000 mg | ORAL_TABLET | Freq: Every day | ORAL | 0 refills | Status: DC
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
## 2018-08-20 NOTE — BHH Suicide Risk Assessment (Signed)
[facility] Discharge Suicide Risk Assessment   Principal Problem: Major depressive disorder, recurrent episode, severe ([place]) Discharge Diagnoses: Principal Problem:   Major depressive disorder, recurrent episode, severe ([place]) Active Problems:   Hypertension   Prediabetes   Suicide attempt ([place])   Total Time spent with patient: 45 minutes  Musculoskeletal: Strength & Muscle Tone: within normal limits Gait & Station: normal Patient leans: N/A  Psychiatric Specialty Exam: Review of Systems  Constitutional: Negative.   HENT: Negative.   Eyes: Negative.   Respiratory: Negative.   Cardiovascular: Negative.   Gastrointestinal: Negative.   Musculoskeletal: Negative.   Skin: Negative.   Neurological: Negative.   Psychiatric/Behavioral: Negative.  Negative for depression, hallucinations, memory loss, substance abuse and suicidal ideas. The patient is not nervous/anxious and does not have insomnia.     Blood pressure 136/75, pulse 80, temperature 98.6 F (37 C), temperature source Oral, SpO2 99 %.There is no height or weight on file to calculate BMI.  General Appearance: Casual  Eye Contact::  Good  Speech:  Clear and [id number]  Volume:  Normal  Mood:  Euthymic  Affect:  Congruent  Thought Process:  Goal Directed  Orientation:  Full (Time, Place, and Person)  Thought Content:  Logical  Suicidal Thoughts:  No  Homicidal Thoughts:  No  Memory:  Immediate;   Fair Recent;   Fair Remote;   Fair  Judgement:  Fair  Insight:  Fair  Psychomotor Activity:  Normal  Concentration:  Fair  Recall:  [employer] [place]  Language: Fair  Akathisia:  No  Handed:  Right  AIMS (if indicated):     Assets:  Desire for Improvement Housing Physical Health Resilience Social Support  Sleep:  Number of Hours: 6.15  Cognition: WNL  ADL's:  Intact   Mental Status Per Nursing Assessment::   On Admission:     Demographic Factors:  Unemployed  Loss Factors: Loss of significant  relationship  Historical Factors: Prior suicide attempts  Risk Reduction Factors:   Responsible for children under 13 years of age, Sense of responsibility to family, Religious beliefs about death, Living with another person, especially a relative, Positive social support and Positive therapeutic relationship  Continued Clinical Symptoms:  Depression:   Impulsivity  Cognitive Features That Contribute To Risk:  Polarized thinking    Suicide Risk:  Minimal: No identifiable suicidal ideation.  Patients presenting with no risk factors but with morbid ruminations; may be classified as minimal risk based on the severity of the depressive symptoms  Follow-up Information    Services, [facility]. Go on 08/25/2018.   Why:  Please attend the following appointment with [facility] at [place] 08/25/2018. You will need to bring photo ID, Medicare card, SS card and proof of household income. Contact information: [place] [address] [place] Follow up on 09/01/2018.   Why:  Please attend appointment at 1pm. Contact information: [place] [address] [phone]           Plan Of Care/Follow-up recommendations:  Activity:  Activity as tolerated Diet:  Regular diet Other:  Follow-up with outpatient treatment as previously  Alethia Berthold, MD 08/20/2018, 3:15 PM

## 2018-08-20 NOTE — Plan of Care (Signed)
Via Gerlene Fee [phone], interpretor services, patient verbalizes understanding of the general information that's been provided to her and all questions/concerns have been addressed and answered at this time. Patient denies SI/HI/AVH as well any signs/symptoms of depression/anxiety stating to this writer "no, I feel good" and "I'm fine". Patient has been present in the milieu when appropriate for her. Patient has the ability to make informed decisions and has been in compliance with her therapeutic regimen. Patient has been free from injury thus far and remains safe on the unit at this time.  Problem: Education: Goal: Knowledge of [facility] Education information/materials will improve Outcome: Progressing Goal: Emotional status will improve Outcome: Progressing Goal: Mental status will improve Outcome: Progressing Goal: Verbalization of understanding the information provided will improve Outcome: Progressing   Problem: Activity: Goal: Interest or engagement in activities will improve Outcome: Progressing Goal: Sleeping patterns will improve Outcome: Progressing   Problem: Safety: Goal: Periods of time without injury will increase Outcome: Progressing   Problem: Coping: Goal: Coping ability will improve Outcome: Progressing Goal: Will verbalize feelings Outcome: Progressing   Problem: Health Behavior/Discharge Planning: Goal: Ability to make decisions will improve Outcome: Progressing Goal: Compliance with therapeutic regimen will improve Outcome: Progressing

## 2018-08-20 NOTE — BHH Group Notes (Signed)
Emotional Regulation 08/20/2018 1PM  Type of Therapy/Topic:  Group Therapy:  Emotion Regulation  Participation Level:  Did Not Attend   Description of Group:   The purpose of this group is to assist patients in learning to regulate negative emotions and experience positive emotions. Patients will be guided to discuss ways in which they have been vulnerable to their negative emotions. These vulnerabilities will be juxtaposed with experiences of positive emotions or situations, and patients will be challenged to use positive emotions to combat negative ones. Special emphasis will be placed on coping with negative emotions in conflict situations, and patients will process healthy conflict resolution skills.  Therapeutic Goals: 1. Patient will identify two positive emotions or experiences to reflect on in order to balance out negative emotions 2. Patient will label two or more emotions that they find the most difficult to experience 3. Patient will demonstrate positive conflict resolution skills through discussion and/or role plays  Summary of Patient Progress:       Therapeutic Modalities:   Cognitive Behavioral Therapy Feelings Identification Dialectical Behavioral Therapy   Yvette Rack, LCSW 08/20/2018 1:59 PM
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
## 2018-08-20 NOTE — Progress Notes (Signed)
Via Gerlene Fee [phone], interpretor services at 0930, this writer spoke with patient and was able to assess the following:  D- Patient alert and oriented. Patient presents in a pleasant mood on assessment reporting that she slept regular last night and had no major complaints to voice to this writer other than she needs her glasses from home so that she can see better. Patient denies any signs/symptoms of depression as well as anxiety, stating "no, I feel good, I'm fine". Patient denies SI, HI, AVH, and pain at this time. Patient had no stated goals for today.  A- Scheduled medications administered to patient, per MD orders. Support and encouragement provided.  Routine safety checks conducted every 15 minutes.  Patient informed to notify staff with problems or concerns.  R- No adverse drug reactions noted. Patient contracts for safety at this time. Patient compliant with medications and treatment plan. Patient receptive, calm, and cooperative. Patient interacts well with others on the unit.  Patient remains safe at this time.

## 2018-08-20 NOTE — Progress Notes (Signed)
Recreation Therapy Notes  Date: 08/20/2018  Time: 9:30 am   Location: Craft room   Behavioral response: N/A   Intervention Topic: Problem Solving  Discussion/Intervention: Patient did not attend group.   Clinical Observations/Feedback:  Patient did not attend group.   Zakkiyya Barno LRT/CTRS         Taelyn Broecker 08/20/2018 10:29 AM

## 2018-08-20 NOTE — BHH Group Notes (Signed)
[place] Group Notes:  (Nursing/MHT/Case Management/Adjunct)  Date:  08/20/2018  Time:  9:40 PM  Type of Therapy:  Group Therapy  Participation Level:  Minimal  Participation Quality:  Appropriate  Affect:  Appropriate  Cognitive:  Confused  Insight:  None  Engagement in Group:  Limited  Modes of Intervention:  Education  Summary of Progress/Problems: [place] does not understand [place]. Emmet asked for clarification from a Spanish speaking patient. MHT informed patients of rules and expectations of the unit. MHT reviewed visitation and call hours. MHT informed patients they needed to provide their code to any one they wanted to contact them. MHT reminded patients that staff would not confirm or deny if they were on the unit if the person did not have the code. MHT encouraged patients not to take food to their rooms. MHT encouraged patients to clean up after themselves. MHT informed patients of routine checks and not to be alarmed if they see or hear techs opening their doors. MHT processed with patients about shift change at 7a and 7p. MHT encouraged patients to get what they need before report started or after it was completed. MHT informed patients not to give out personal information and to only use first names while on the unit. MHT discussed resources that were available in the community. Barnie Mort 08/20/2018, 9:40 PM

## 2018-08-20 NOTE — Progress Notes (Signed)
This patient spoke with Florentina Jenny [phone], via interpretor services, to speak with patient during her evening medication pass and she verbalized understanding of the information that's been given to her. Patient asked this Probation officer for sleeping medication tonight and this Probation officer informed patient that this information would be passed along to the next shift. Patient had no further questions or concerns at this time.

## 2018-08-20 NOTE — Progress Notes (Signed)
Admission Note:  D: 63 yr female who presents IVC in no acute distress for the treatment of SI and Depression. Pt appears flat and depressed. Pt was calm and cooperative with admission process. Minimal admission was able to be done due to inoperable translation machine. Pt did state she was depressed. Pt came to nursing pt for HA 7/10 that was relieved with Tylenol   A:Skin was assessed by female nurse on unit. PT searched and no contraband found, POC and unit policies explained and understanding verbalized. Consents obtained. Food and fluids offered, and fluids accepted.  R: Pt had no additional questions or concerns.

## 2018-08-20 NOTE — BHH Suicide Risk Assessment (Signed)
[facility] Admission Suicide Risk Assessment   Nursing information obtained from:    Demographic factors:    Current Mental Status:    Loss Factors:    Historical Factors:    Risk Reduction Factors:     Total Time spent with patient: 1 hour Principal Problem: <principal problem not specified> Diagnosis:  Active Problems:   Suicide attempt [facility])  Subjective Data: Patient admitted to the hospital after taking an overdose of multiple medicines.  Patient describes symptoms of depression and an impulsive suicide attempt.  Today she says she no longer has any wish to die.  She is grateful that her family got her to the hospital.  Patient's mood is slightly better and she feels more hopeful.  No current suicidal ideation  Continued Clinical Symptoms:  Alcohol Use Disorder Identification Test Final Score (AUDIT): 0 The "Alcohol Use Disorders Identification Test", Guidelines for Use in Primary Care, Second Edition.  World Pharmacologist [facility]). Score between 0-7:  no or low risk or alcohol related problems. Score between 8-15:  moderate risk of alcohol related problems. Score between 16-19:  high risk of alcohol related problems. Score 20 or above:  warrants further diagnostic evaluation for alcohol dependence and treatment.   CLINICAL FACTORS:   Depression:   Impulsivity   Musculoskeletal: Strength & Muscle Tone: within normal limits Gait & Station: normal Patient leans: N/A  Psychiatric Specialty Exam: Physical Exam  Nursing note and vitals reviewed. Constitutional: She appears well-developed and well-nourished.  HENT:  Head: Normocephalic and atraumatic.  Eyes: Pupils are equal, round, and reactive to light. Conjunctivae are normal.  Neck: Normal range of motion.  Cardiovascular: Regular rhythm and normal heart sounds.  Respiratory: Effort normal.  GI: Soft.  Musculoskeletal: Normal range of motion.  Neurological: She is alert.  Skin: Skin is warm and dry.  Psychiatric: Her  speech is normal and behavior is normal. Judgment and thought content normal. Her affect is blunt. Cognition and memory are normal.    Review of Systems  Constitutional: Negative.   HENT: Negative.   Eyes: Negative.   Respiratory: Negative.   Cardiovascular: Negative.   Gastrointestinal: Negative.   Musculoskeletal: Negative.   Skin: Negative.   Neurological: Negative.   Psychiatric/Behavioral: Positive for depression. Negative for hallucinations, memory loss, substance abuse and suicidal ideas. The patient is not nervous/anxious and does not have insomnia.     Blood pressure 136/75, pulse 80, temperature 98.6 F (37 C), temperature source Oral, SpO2 99 %.There is no height or weight on file to calculate BMI.  General Appearance: Casual  Eye Contact:  Good  Speech:  Clear and Coherent  Volume:  Normal  Mood:  Dysphoric  Affect:  Appropriate  Thought Process:  Goal Directed  Orientation:  Full (Time, Place, and Person)  Thought Content:  Logical  Suicidal Thoughts:  No  Homicidal Thoughts:  No  Memory:  Immediate;   Fair Recent;   Fair Remote;   Fair  Judgement:  Fair  Insight:  Fair  Psychomotor Activity:  Decreased  Concentration:  Concentration: Fair  Recall:  [employer] of Knowledge:  Fair  Language:  Fair  Akathisia:  No  Handed:  Right  AIMS (if indicated):     Assets:  Desire for Improvement Housing Physical Health Social Support  ADL's:  Intact  Cognition:  WNL  Sleep:  Number of Hours: 6.15      COGNITIVE FEATURES THAT CONTRIBUTE TO RISK:  Polarized thinking    SUICIDE RISK:  Mild:  Suicidal ideation of limited frequency, intensity, duration, and specificity.  There are no identifiable plans, no associated intent, mild dysphoria and related symptoms, good self-control (both objective and subjective assessment), few other risk factors, and identifiable protective factors, including available and accessible social support.  PLAN OF CARE: Patient  admitted to the hospital.  15-minute checks in place.  Assess for depression and appropriate treatment needs.  Appropriate treatment will be administered and she will be reassessed prior to discharge  I certify that inpatient services furnished can reasonably be expected to improve the patient's condition.   Alethia Berthold, MD 08/20/2018, 3:04 PM

## 2018-08-20 NOTE — BHH Suicide Risk Assessment (Signed)
[place] INPATIENT:  Family/Significant Other Suicide Prevention Education  Suicide Prevention Education:  Education Completed; Ledon Snare, daughter, [phone] has been identified by the patient as the family member/significant other with whom the patient will be residing, and identified as the person(s) who will aid the patient in the event of a mental health crisis (suicidal ideations/suicide attempt).  With written consent from the patient, the family member/significant other has been provided the following suicide prevention education, prior to the and/or following the discharge of the patient.  The suicide prevention education provided includes the following:  Suicide risk factors  Suicide prevention and interventions  National Suicide Hotline telephone number  [facility] assessment telephone number  [facility] Emergency Assistance [place] [facility] and/or Residential Mobile Crisis Unit telephone number  Request made of family/significant other to:  Remove weapons (e.g., guns, rifles, knives), all items previously/currently identified as safety concern.    Remove drugs/medications (over-the-counter, prescriptions, illicit drugs), all items previously/currently identified as a safety concern.  The family member/significant other verbalizes understanding of the suicide prevention education information provided.  The family member/significant other agrees to remove the items of safety concern listed above.  CSW spoke with the patient's daughter who reports that they family would like for the patient to not return home to her husband.    Rozann Lesches 08/20/2018, 3:34 PM

## 2018-08-21 NOTE — BHH Counselor (Signed)
CSW spoke with the pt's daughter, Ledon Snare, daughter, [phone] at 11:42AM.  CSW informed of discharge, upcoming appointments and that the pt gave nurse permission to call her son for transportation  Home.   Assunta Curtis, MSW, LCSW 08/21/2018 3:30 PM

## 2018-08-21 NOTE — Progress Notes (Signed)
Patient alert and oriented x 4. Ambulates unit with steady gait. Verbally denies SI/HI/AVH and pain. Patient discharged on above date and time. Verbalized understanding the discharge information provided to patient upon discharge. Patient departed unit with discharge paperwork, prescriptions and personal belongings. Picked up by her daughter and the discharge information was explained to the daughter per this Probation officer, understanding of instructions verbalized.

## 2018-08-21 NOTE — Discharge Summary (Signed)
Physician Discharge Summary Note  Patient:  Patricia Hamilton is an 63 y.o., female MRN:  [id number] DOB:  1956/02/10 Patient phone:  [phone] (home)  Patient address:   [address] [place] [address],  Total Time spent with patient: 45 minutes  Date of Admission:  08/19/2018 Date of Discharge: August 21, 2018  Reason for Admission: Admitted after being brought to the hospital from an overdose of prescription medication.  Initially reporting suicidal ideation.  Patient was evaluated medically and psychiatrically with the assistance of Spanish language interpreter's.  Principal Problem: Major depressive disorder, recurrent episode, severe ([place]) Discharge Diagnoses: Principal Problem:   Major depressive disorder, recurrent episode, severe ([place]) Active Problems:   Hypertension   Prediabetes   Suicide attempt [facility])   Past Psychiatric History: Past history of depression no previous suicide attempts  Past Medical History:  Past Medical History:  Diagnosis Date  . Depression   . Diabetes mellitus without complication (HCC)    no meds  . Hyperlipidemia   . Hypertension     Past Surgical History:  Procedure Laterality Date  . ABDOMINAL SURGERY    . COLONOSCOPY    . COLONOSCOPY WITH PROPOFOL N/A 01/21/2017   Procedure: COLONOSCOPY WITH PROPOFOL;  Surgeon: Lollie Sails, MD;  Location: [facility] ENDOSCOPY;  Service: Endoscopy;  Laterality: N/A;  . ESOPHAGOGASTRODUODENOSCOPY    . Laproscopic Tubal Ligation     Family History:  Family History  Problem Relation Age of Onset  . Breast cancer Neg Hx    Family Psychiatric  History: Brother with depression Social History:  Social History   Substance and Sexual Activity  Alcohol Use No     Social History   Substance and Sexual Activity  Drug Use Not on file    Social History   Socioeconomic History  . Marital status: Married    Spouse name: Not on file  . Number of children: Not on file  . Years of education: Not on  file  . Highest education level: Not on file  Occupational History  . Not on file  Social Needs  . Financial resource strain: Not on file  . Food insecurity:    Worry: Not on file    Inability: Not on file  . Transportation needs:    Medical: Not on file    Non-medical: Not on file  Tobacco Use  . Smoking status: Never Smoker  . Smokeless tobacco: Never Used  Substance and Sexual Activity  . Alcohol use: No  . Drug use: Not on file  . Sexual activity: Not on file  Lifestyle  . Physical activity:    Days per week: Not on file    Minutes per session: Not on file  . Stress: Not on file  Relationships  . Social connections:    Talks on phone: Not on file    Gets together: Not on file    Attends religious service: Not on file    Active member of club or organization: Not on file    Attends meetings of clubs or organizations: Not on file    Relationship status: Not on file  Other Topics Concern  . Not on file  Social History Narrative  . Not on file    Hospital Course: Patient admitted to the hospital and evaluated with assistance of Spanish language interpreter's.  Patient denied any ongoing suicidal ideation.  Showed good insight.  Spoke with her husband and worked on reconciliation.  Slight increase made to antidepressant medicine.  Otherwise no change to current medicine protocol.  Patient advised about the risks of suicidal ideation and the importance of following up both with a doctor and therapist.  At this point does not appear to meet commitment criteria any longer and is agreeable to discharge to the community.  Prescriptions completed.  Case reviewed with treatment team.  Physical Findings: AIMS: Facial and Oral Movements Muscles of Facial Expression: None, normal Lips and Perioral Area: None, normal Jaw: None, normal Tongue: None, normal,Extremity Movements Upper (arms, wrists, hands, fingers): None, normal Lower (legs, knees, ankles, toes): None, normal, Trunk  Movements Neck, shoulders, hips: None, normal, Overall Severity Severity of abnormal movements (highest score from questions above): None, normal Incapacitation due to abnormal movements: None, normal Patient's awareness of abnormal movements (rate only patient's report): No Awareness, Dental Status Current problems with teeth and/or dentures?: No Does patient usually wear dentures?: No  CIWA:  CIWA-Ar Total: 0 COWS:  COWS Total Score: 0  Musculoskeletal: Strength & Muscle Tone: within normal limits Gait & Station: normal Patient leans: N/A  Psychiatric Specialty Exam: Physical Exam  Nursing note and vitals reviewed. Constitutional: She appears well-developed and well-nourished.  HENT:  Head: Normocephalic and atraumatic.  Eyes: Pupils are equal, round, and reactive to light. Conjunctivae are normal.  Neck: Normal range of motion.  Cardiovascular: Regular rhythm and normal heart sounds.  Respiratory: Effort normal. No respiratory distress.  GI: Soft.  Musculoskeletal: Normal range of motion.  Neurological: She is alert.  Skin: Skin is warm and dry.  Psychiatric: She has a normal mood and affect. Her behavior is normal. Judgment and thought content normal.    Review of Systems  Constitutional: Negative.   HENT: Negative.   Eyes: Negative.   Respiratory: Negative.   Cardiovascular: Negative.   Gastrointestinal: Negative.   Musculoskeletal: Negative.   Skin: Negative.   Neurological: Negative.   Psychiatric/Behavioral: Negative.     Blood pressure (!) 141/85, pulse 77, temperature 98.6 F (37 C), temperature source Oral, resp. rate 18, SpO2 98 %.There is no height or weight on file to calculate BMI.  General Appearance: Casual  Eye Contact:  Good  Speech:  Clear and Coherent  Volume:  Normal  Mood:  Euthymic  Affect:  Congruent  Thought Process:  Goal Directed  Orientation:  Full (Time, Place, and Person)  Thought Content:  Logical  Suicidal Thoughts:  No   Homicidal Thoughts:  No  Memory:  Immediate;   Fair Recent;   Fair Remote;   Fair  Judgement:  Fair  Insight:  Fair  Psychomotor Activity:  Normal  Concentration:  Concentration: Fair  Recall:  [employer] of Knowledge:  Fair  Language:  Fair  Akathisia:  No  Handed:  Right  AIMS (if indicated):     Assets:  Desire for Improvement Housing Physical Health  ADL's:  Intact  Cognition:  WNL  Sleep:  Number of Hours: 6.5     Have you used any form of tobacco in the last 30 days? (Cigarettes, Smokeless Tobacco, Cigars, and/or Pipes): No  Has this patient used any form of tobacco in the last 30 days? (Cigarettes, Smokeless Tobacco, Cigars, and/or Pipes) Yes, No  Blood Alcohol level:  Lab Results  Component Value Date   ETH <10 24/58/0998    Metabolic Disorder Labs:  No results found for: HGBA1C, MPG No results found for: PROLACTIN No results found for: CHOL, TRIG, HDL, CHOLHDL, VLDL, LDLCALC  See Psychiatric Specialty Exam and Suicide Risk Assessment completed  by Attending Physician prior to discharge.  Discharge destination:  Home  Is patient on multiple antipsychotic therapies at discharge:  No   Has Patient had three or more failed trials of antipsychotic monotherapy by history:  No  Recommended Plan for Multiple Antipsychotic Therapies: NA  Discharge Instructions    Diet - low sodium heart healthy   Complete by:  As directed    Increase activity slowly   Complete by:  As directed      Allergies as of 08/21/2018   No Known Allergies     Medication List    TAKE these medications     Indication  cetirizine 10 MG chewable tablet Commonly known as:  ZYRTEC Chew 10 mg by mouth daily as needed for allergies.  Indication:  Upper Respiratory Tract Allergy   lisinopril 20 MG tablet Commonly known as:  PRINIVIL,ZESTRIL Take 1 tablet (20 mg total) by mouth daily.  Indication:  High Blood Pressure Disorder   lubiprostone 8 MCG capsule Commonly known as:   AMITIZA Take 1 capsule (8 mcg total) by mouth 2 (two) times daily with a meal.  Indication:  Chronic Constipation of Unknown Cause   Milnacipran 50 MG Tabs tablet Commonly known as:  SAVELLA Take 1 tablet (50 mg total) by mouth 2 (two) times daily.  Indication:  Fibromyalgia Syndrome   pantoprazole 40 MG tablet Commonly known as:  PROTONIX Take 1 tablet (40 mg total) by mouth daily.  Indication:  Gastroesophageal Reflux Disease   propranolol 60 MG tablet Commonly known as:  INDERAL Take 1 tablet (60 mg total) by mouth daily.  Indication:  Migraine Headache   sertraline 100 MG tablet Commonly known as:  ZOLOFT Take 1.5 tablets (150 mg total) by mouth daily. What changed:  how much to take  Indication:  Major Depressive Disorder   traZODone 50 MG tablet Commonly known as:  DESYREL Take 1 tablet (50 mg total) by mouth at bedtime as needed for sleep.  Indication:  Trouble Sleeping      Follow-up Information    [facility]. Go on 08/25/2018.   Why:  Please attend the following appointment with [facility] at [place] 08/25/2018. You will need to bring photo ID, Medicare card, SS card and proof of household income. Contact information: [place] [address] [address] Follow up on 09/01/2018.   Why:  Please attend appointment at 1pm. Contact information: [place] [address] [phone]           Follow-up recommendations:  Activity:  Activity as tolerated Diet:  Regular diet Other:  Follow-up outpatient providers  Comments: Discharge planning completed with the assistance of Spanish language interpreter.  Prescriptions provided Signed: Alethia Berthold, MD 08/21/2018, 10:27 AM

## 2018-08-21 NOTE — BHH Counselor (Signed)
CSW utilized [employer], Acupuncturist, to translate for patient that she will be discharged on 08/21/18.  CSW also informed of appointment dates and times and transportation home.   Assunta Curtis, MSW, LCSW 08/21/2018 10:30 AM

## 2018-08-21 NOTE — Plan of Care (Signed)
Cooperative with treatment , visible in  milieu, pleasant on approach, no behavioral issues to report on shift.

## 2018-08-21 NOTE — Tx Team (Signed)
Interdisciplinary Treatment and Diagnostic Plan Update  08/21/2018 Time of Session: 8:30AM Patricia Hamilton MRN: [id number]  Principal Diagnosis: Major depressive disorder, recurrent episode, severe ([place])  Secondary Diagnoses: Principal Problem:   Major depressive disorder, recurrent episode, severe ([place]) Active Problems:   Hypertension   Prediabetes   Suicide attempt ([place])   Current Medications:  Current Facility-Administered Medications  Medication Dose Route Frequency Provider Last Rate Last Dose  . acetaminophen (TYLENOL) tablet 650 mg  650 mg Oral Q6H PRN Lavella Hammock, MD   650 mg at 08/19/18 2322  . alum & mag hydroxide-simeth (MAALOX/MYLANTA) 200-200-20 MG/5ML suspension 30 mL  30 mL Oral Q4H PRN Lavella Hammock, MD      . hydrOXYzine (ATARAX/VISTARIL) tablet 50 mg  50 mg Oral Q6H PRN Lavella Hammock, MD      . lisinopril (PRINIVIL,ZESTRIL) tablet 20 mg  20 mg Oral Daily Lavella Hammock, MD   20 mg at 08/21/18 0938  . loratadine (CLARITIN) tablet 10 mg  10 mg Oral Daily Lavella Hammock, MD   10 mg at 08/21/18 1829  . lubiprostone (AMITIZA) capsule 8 mcg  8 mcg Oral BID WC Lavella Hammock, MD   8 mcg at 08/20/18 1721  . magnesium hydroxide (MILK OF MAGNESIA) suspension 30 mL  30 mL Oral Daily PRN Lavella Hammock, MD      . Milnacipran [facility]) tablet TABS 50 mg  50 mg Oral BID Lavella Hammock, MD   50 mg at 08/21/18 0820  . pantoprazole (PROTONIX) EC tablet 40 mg  40 mg Oral Daily Lavella Hammock, MD   40 mg at 08/21/18 0820  . propranolol (INDERAL) tablet 60 mg  60 mg Oral Daily Lavella Hammock, MD   60 mg at 08/21/18 0820  . sertraline (ZOLOFT) tablet 150 mg  150 mg Oral Daily Clapacs, Madie Reno, MD   150 mg at 08/21/18 0820  . traZODone (DESYREL) tablet 50 mg  50 mg Oral QHS PRN Lavella Hammock, MD   50 mg at 08/20/18 2151   PTA Medications: Medications Prior to Admission  Medication Sig Dispense Refill Last Dose  . cetirizine (ZYRTEC) 10 MG chewable tablet Chew 10  mg by mouth daily as needed for allergies.     Marland Kitchen propranolol (INDERAL) 60 MG tablet Take 60 mg by mouth daily.     . sertraline (ZOLOFT) 100 MG tablet Take 100 mg by mouth daily.     . [DISCONTINUED] lisinopril (PRINIVIL,ZESTRIL) 20 MG tablet Take 20 mg by mouth daily.     . [DISCONTINUED] lubiprostone (AMITIZA) 8 MCG capsule Take 8 mcg by mouth 2 (two) times daily with a meal.     . [DISCONTINUED] Milnacipran (SAVELLA) 50 MG TABS tablet Take 50 mg by mouth 2 (two) times daily.     . [DISCONTINUED] pantoprazole (PROTONIX) 40 MG tablet Take 40 mg by mouth daily.     . [DISCONTINUED] traZODone (DESYREL) 50 MG tablet Take 50 mg by mouth at bedtime as needed for sleep.       Patient Stressors:    Patient Strengths:    Treatment Modalities: Medication Management, Group therapy, Case management,  1 to 1 session with clinician, Psychoeducation, Recreational therapy.   Physician Treatment Plan for Primary Diagnosis: Major depressive disorder, recurrent episode, severe ([place]) Long Term Goal(s): Improvement in symptoms so as ready for discharge Improvement in symptoms so as ready for discharge   Short Term Goals: Ability to verbalize feelings will improve  Ability to disclose and discuss suicidal ideas Ability to maintain clinical measurements within normal limits will improve  Medication Management: Evaluate patient's response, side effects, and tolerance of medication regimen.  Therapeutic Interventions: 1 to 1 sessions, Unit Group sessions and Medication administration.  Evaluation of Outcomes: Adequate for Discharge  Physician Treatment Plan for Secondary Diagnosis: Principal Problem:   Major depressive disorder, recurrent episode, severe ([place]) Active Problems:   Hypertension   Prediabetes   Suicide attempt ([place])  Long Term Goal(s): Improvement in symptoms so as ready for discharge Improvement in symptoms so as ready for discharge   Short Term Goals: Ability to verbalize feelings  will improve Ability to disclose and discuss suicidal ideas Ability to maintain clinical measurements within normal limits will improve     Medication Management: Evaluate patient's response, side effects, and tolerance of medication regimen.  Therapeutic Interventions: 1 to 1 sessions, Unit Group sessions and Medication administration.  Evaluation of Outcomes: Adequate for Discharge   RN Treatment Plan for Primary Diagnosis: Major depressive disorder, recurrent episode, severe ([place]) Long Term Goal(s): Knowledge of disease and therapeutic regimen to maintain health will improve  Short Term Goals: Ability to verbalize feelings will improve, Ability to disclose and discuss suicidal ideas, Ability to identify and develop effective coping behaviors will improve and Compliance with prescribed medications will improve  Medication Management: RN will administer medications as ordered by provider, will assess and evaluate patient's response and provide education to patient for prescribed medication. RN will report any adverse and/or side effects to prescribing provider.  Therapeutic Interventions: 1 on 1 counseling sessions, Psychoeducation, Medication administration, Evaluate responses to treatment, Monitor vital signs and CBGs as ordered, Perform/monitor CIWA, COWS, AIMS and Fall Risk screenings as ordered, Perform wound care treatments as ordered.  Evaluation of Outcomes: Adequate for Discharge   LCSW Treatment Plan for Primary Diagnosis: Major depressive disorder, recurrent episode, severe ([place]) Long Term Goal(s): Safe transition to appropriate next level of care at discharge, Engage patient in therapeutic group addressing interpersonal concerns.  Short Term Goals: Engage patient in aftercare planning with referrals and resources, Increase social support, Increase ability to appropriately verbalize feelings and Increase emotional regulation  Therapeutic Interventions: Assess for all discharge  needs, 1 to 1 time with Social worker, Explore available resources and support systems, Assess for adequacy in community support network, Educate family and significant other(s) on suicide prevention, Complete Psychosocial Assessment, Interpersonal group therapy.  Evaluation of Outcomes: Adequate for Discharge   Progress in Treatment: Attending groups: No. Participating in groups: No. Taking medication as prescribed: Yes. Toleration medication: Yes. Family/Significant other contact made: Yes, individual(s) contacted:  SPE completed with daughter. Patient understands diagnosis: Yes. Discussing patient identified problems/goals with staff: Yes. Medical problems stabilized or resolved: Yes. Denies suicidal/homicidal ideation: Yes. Issues/concerns per patient self-inventory: No. Other: none  New problem(s) identified: No, Describe:  none  New Short Term/Long Term Goal(s): medication management for mood stabilization; elimination of SI thoughts; development of comprehensive mental wellness/sobriety plan.  Patient Goals:  "get home and handle my family situation" CSW notes that goal was obtained during assessment where Leotis Shames was the Spanish interpreter  Discharge Plan or Barriers:  Patient denies any barriers.  Pt requested outpatient therapy.  CSW has scheduled the patient a follow up with her PCP who prescribes the patient with her depression medications as well as a therapy appointment with a local provider.   Reason for Continuation of Hospitalization: Depression Medication stabilization Suicidal ideation  Estimated Length of Stay:  Attendees: Patient: Patricia Hamilton 08/21/2018 3:30 PM  Physician: Dr. Weber Cooks, MD 08/21/2018 3:30 PM  Nursing:  08/21/2018 3:30 PM  RN Care Manager: 08/21/2018 3:30 PM  Social Worker: Assunta Curtis, [place] 08/21/2018 3:30 PM  Recreational Therapist:  08/21/2018 3:30 PM  Other:  08/21/2018 3:30 PM  Other:  08/21/2018 3:30 PM  Other: 08/21/2018 3:30 PM     Scribe for Treatment Team: Rozann Lesches, LCSW 08/21/2018 3:30 PM

## 2018-08-21 NOTE — BHH Group Notes (Signed)
LCSW Group Therapy Note  08/21/2018 1:00 PM  Type of Therapy/Topic:  Group Therapy:  Balance in Life  Participation Level:  Did Not Attend  Description of Group:    This group will address the concept of balance and how it feels and looks when one is unbalanced. Patients will be encouraged to process areas in their lives that are out of balance and identify reasons for remaining unbalanced. Facilitators will guide patients in utilizing problem-solving interventions to address and correct the stressor making their life unbalanced. Understanding and applying boundaries will be explored and addressed for obtaining and maintaining a balanced life. Patients will be encouraged to explore ways to assertively make their unbalanced needs known to significant others in their lives, using other group members and facilitator for support and feedback.  Therapeutic Goals: 1. Patient will identify two or more emotions or situations they have that consume much of in their lives. 2. Patient will identify signs/triggers that life has become out of balance:  3. Patient will identify two ways to set boundaries in order to achieve balance in their lives:  4. Patient will demonstrate ability to communicate their needs through discussion and/or role plays  Summary of Patient Progress:      Therapeutic Modalities:   Cognitive Behavioral Therapy Solution-Focused Therapy Assertiveness Training  Assunta Curtis MSW, LCSW 08/21/2018 3:27 PM

## 2018-08-21 NOTE — Progress Notes (Signed)
  [facility] Adult Case Management Discharge Plan :  Will you be returning to the same living situation after discharge:  Yes,  pt is returning home. At discharge, do you have transportation home?: Yes,  pt reports her son will provide transportation Do you have the ability to pay for your medications: Yes,  Medicare  Release of information consent forms completed and in the chart;  Patient's signature needed at discharge.  Patient to Follow up at: Follow-up Information    [facility]. Go on 08/25/2018.   Why:  Please attend the following appointment with [facility] [place] 08/25/2018. You will need to bring photo ID, Medicare card, SS card and proof of household income. Contact information: [place] [address] [address] [place] Follow up on 09/01/2018.   Why:  Please attend appointment at 1pm. Contact information: [place] [address] [phone]           Next level of care provider has access to [place] and Suicide Prevention discussed: Yes,  SPE completed with pt's daughter.  Have you used any form of tobacco in the last 30 days? (Cigarettes, Smokeless Tobacco, Cigars, and/or Pipes): No  Has patient been referred to the Quitline?: N/A patient is not a smoker  Patient has been referred for addiction treatment: [place], LCSW 08/21/2018, 10:32 AM

## 2018-08-21 NOTE — Progress Notes (Signed)
Recreation Therapy Notes   Date: 08/21/2018  Time: 9:30 am   Location: Craft room   Behavioral response: N/A   Intervention Topic: Time Management  Discussion/Intervention: Patient did not attend group.   Clinical Observations/Feedback:  Patient did not attend group.   Lauraann Missey LRT/CTRS         Zhyon Antenucci 08/21/2018 11:40 AM

## 2018-08-21 NOTE — BHH Counselor (Signed)
CSW called to reschedule the pt aftercare appointment with [facility] due to pt's reports that she has a dentist appointment. CSW had to leave a message. Assunta Curtis, MSW, LCSW 08/21/2018 10:31 AM

## 2019-01-08 ENCOUNTER — Other Ambulatory Visit: Payer: Self-pay | Admitting: Family Medicine

## 2019-01-08 DIAGNOSIS — Z1231 Encounter for screening mammogram for malignant neoplasm of breast: Secondary | ICD-10-CM

## 2019-04-09 ENCOUNTER — Ambulatory Visit
Admission: RE | Admit: 2019-04-09 | Discharge: 2019-04-09 | Disposition: A | Discharge status: Home / Self Care | Payer: Medicare HMO | Source: Ambulatory Visit | Attending: Family Medicine | Admitting: Family Medicine

## 2019-04-09 DIAGNOSIS — Z1231 Encounter for screening mammogram for malignant neoplasm of breast: Secondary | ICD-10-CM | POA: Diagnosis present

## 2019-04-24 ENCOUNTER — Other Ambulatory Visit: Payer: Self-pay | Admitting: Family Medicine

## 2019-04-24 ENCOUNTER — Ambulatory Visit
Admission: RE | Admit: 2019-04-24 | Discharge: 2019-04-24 | Disposition: A | Discharge status: Home / Self Care | Payer: Medicare HMO | Source: Ambulatory Visit | Attending: Family Medicine | Admitting: Family Medicine

## 2019-04-24 DIAGNOSIS — M25551 Pain in right hip: Secondary | ICD-10-CM

## 2019-04-30 ENCOUNTER — Other Ambulatory Visit: Payer: Self-pay | Admitting: Family Medicine

## 2019-04-30 DIAGNOSIS — M25552 Pain in left hip: Secondary | ICD-10-CM

## 2019-05-04 ENCOUNTER — Ambulatory Visit
Admission: RE | Admit: 2019-05-04 | Discharge: 2019-05-04 | Disposition: A | Discharge status: Home / Self Care | Payer: Medicare HMO | Source: Ambulatory Visit | Attending: Family Medicine | Admitting: Family Medicine

## 2019-05-04 DIAGNOSIS — M25552 Pain in left hip: Secondary | ICD-10-CM | POA: Insufficient documentation

## 2020-01-28 IMAGING — MG MM DIGITAL SCREENING BILAT W/ TOMO W/ CAD
8 series · 8 of 24 positions shown · non-contrast
Comparison: Previous exam(s).

CLINICAL DATA: Screening.

EXAM:
DIGITAL SCREENING BILATERAL MAMMOGRAM WITH TOMO AND CAD

[R MLO synth-2D]
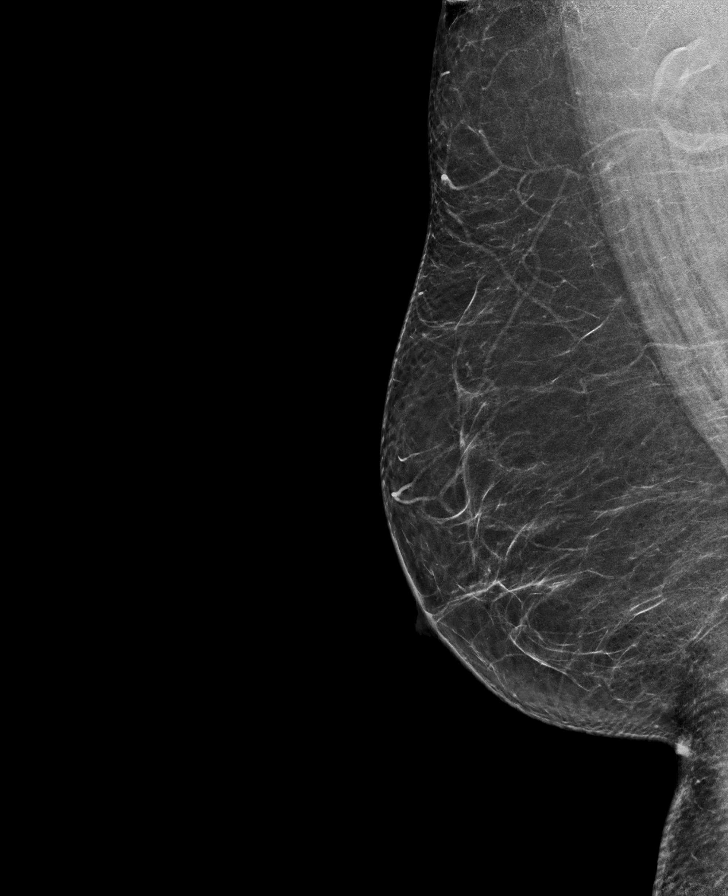

[L MLO synth-2D]
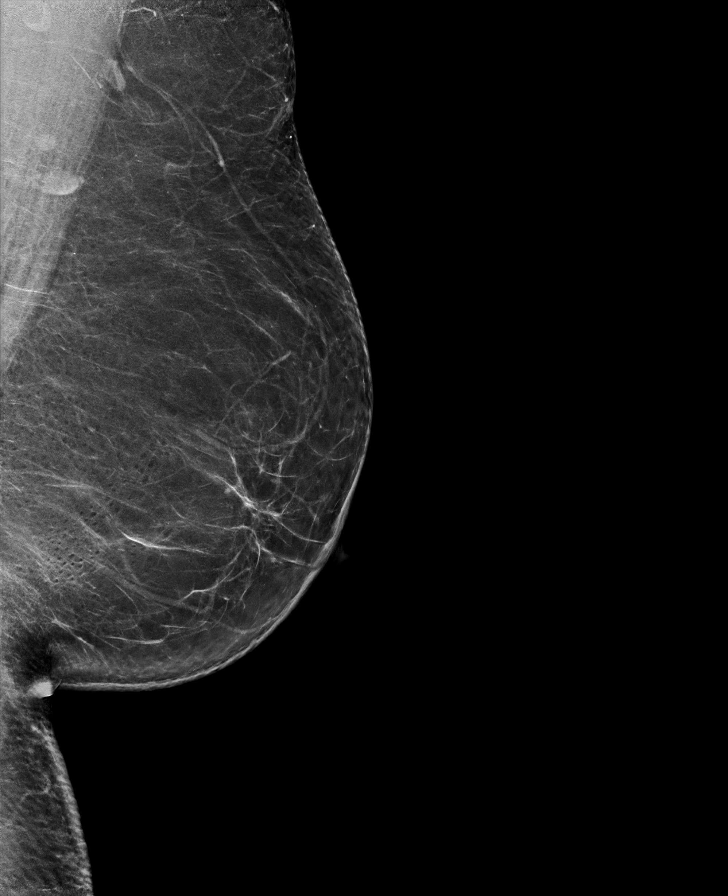

[L CC synth-2D]
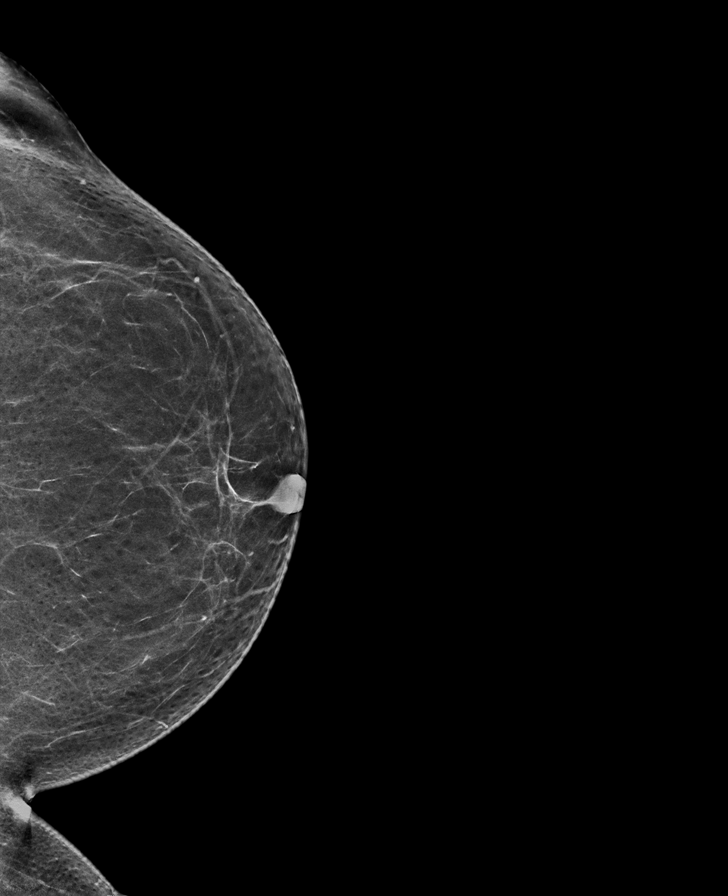

[R CC synth-2D]
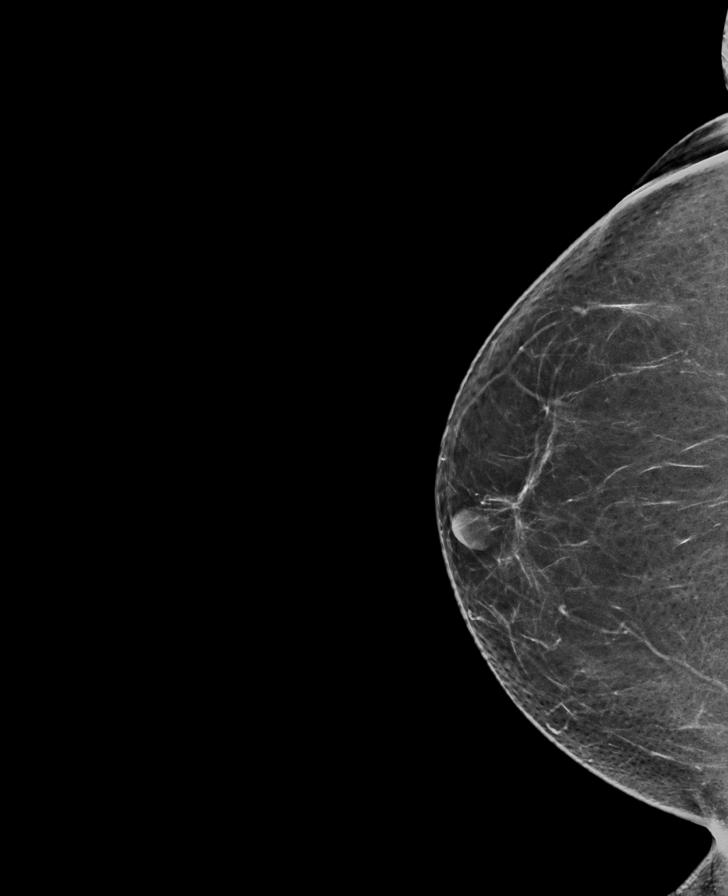

[R CC tomo · tomo slice 35/68.0]
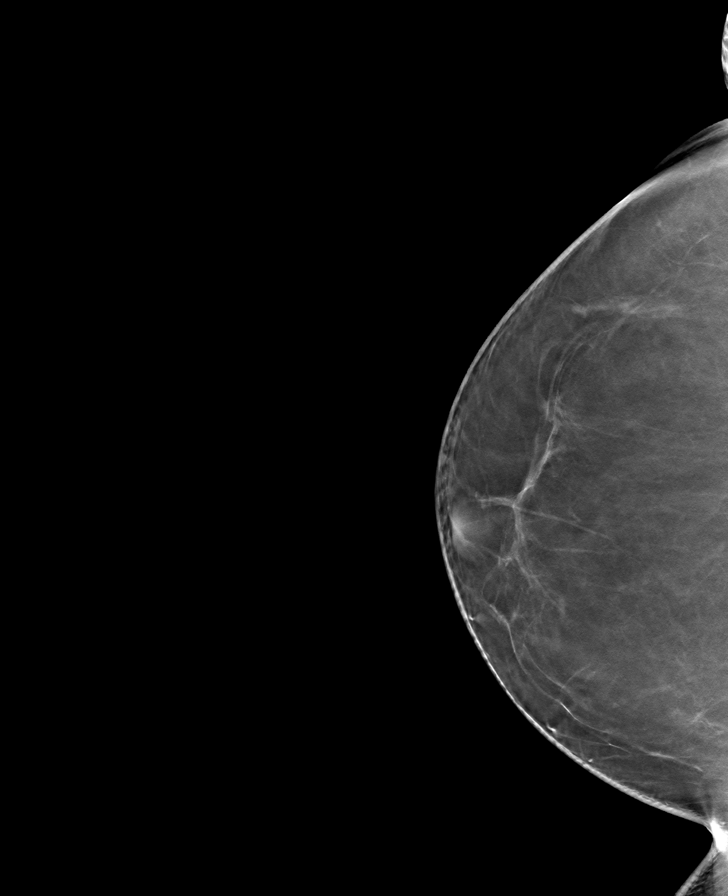

[L CC tomo · tomo slice 34/67.0]
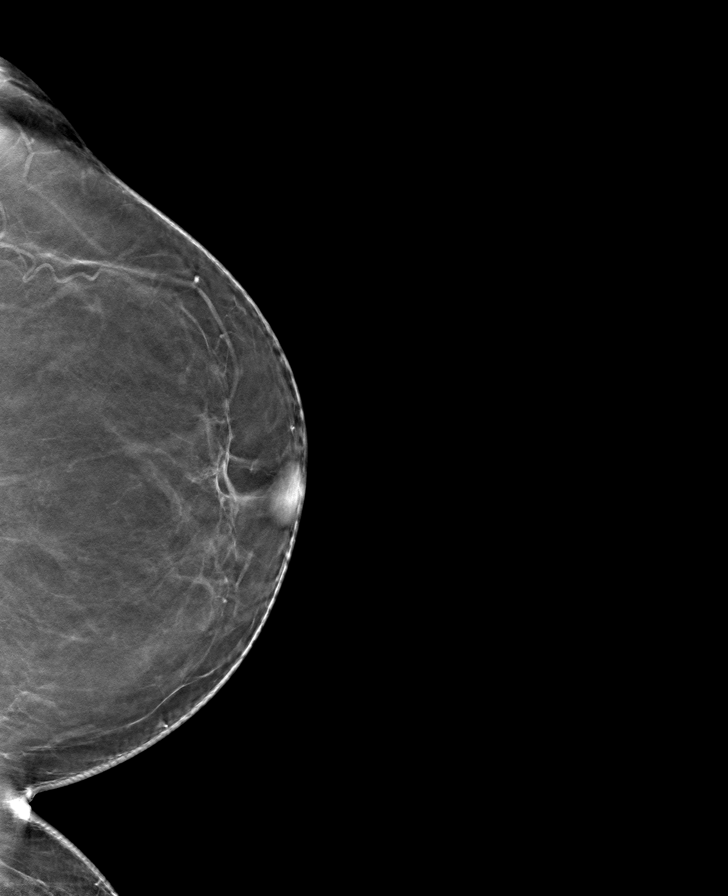

[L MLO tomo · tomo slice 38/75.0]
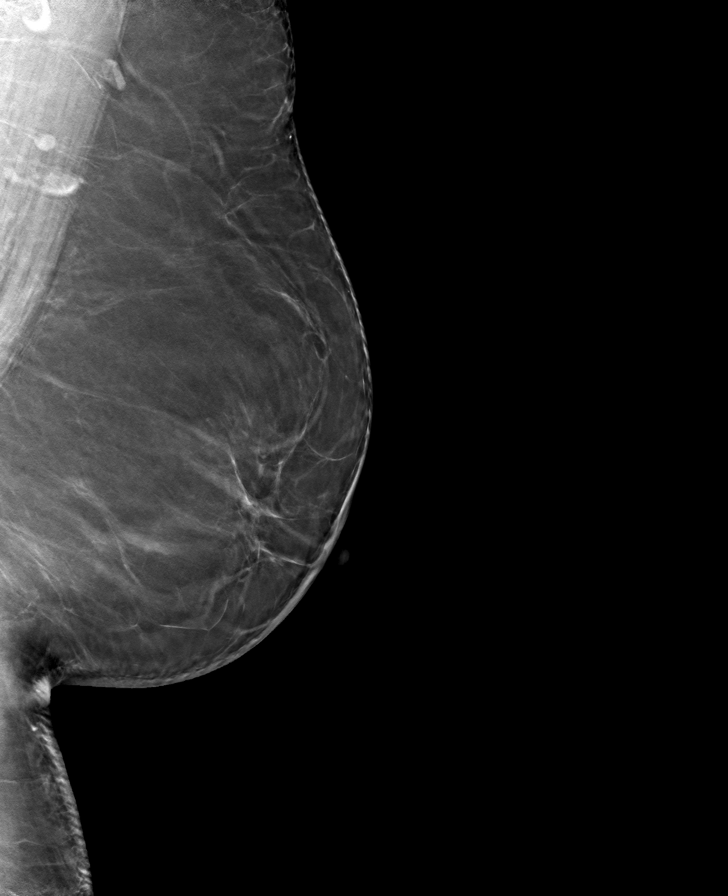

[R MLO tomo · tomo slice 35/68.0]
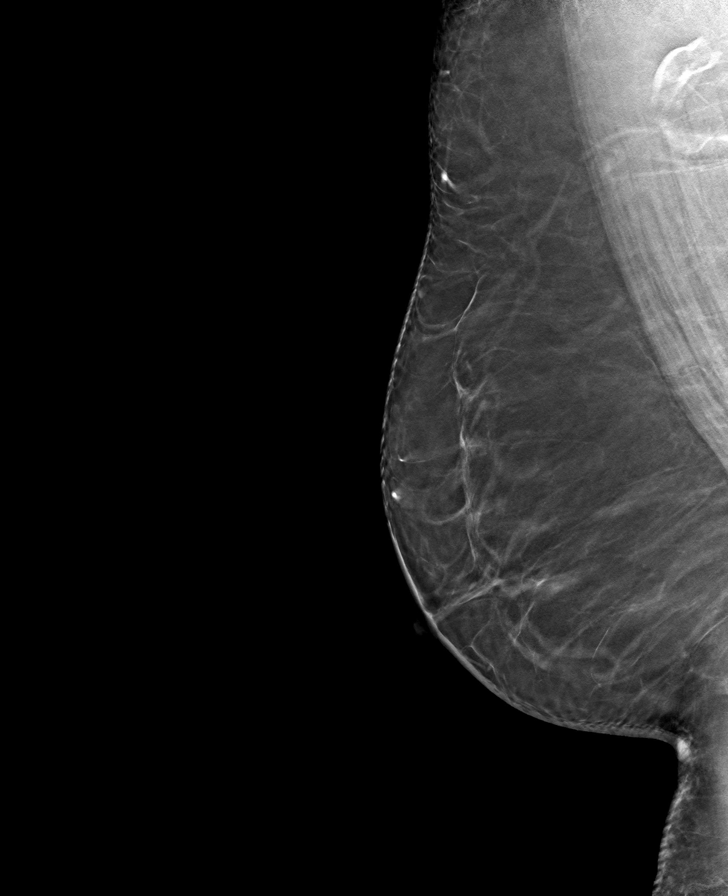

[8 of 24 positions shown; findings below may reference images not displayed]

ACR Breast Density Category b: There are scattered areas of
fibroglandular density.
FINDINGS: There are no findings suspicious for malignancy. Images were
processed with CAD.
IMPRESSION: No mammographic evidence of malignancy. A result letter of this
screening mammogram will be mailed directly to the patient.

RECOMMENDATION:
Screening mammogram in one year. (Code:[id number])

BI-RADS CATEGORY  1: Negative.

## 2020-04-08 ENCOUNTER — Other Ambulatory Visit: Payer: Self-pay | Admitting: Family Medicine

## 2020-04-08 DIAGNOSIS — R7989 Other specified abnormal findings of blood chemistry: Secondary | ICD-10-CM

## 2020-04-19 ENCOUNTER — Ambulatory Visit
Admission: RE | Admit: 2020-04-19 | Discharge: 2020-04-19 | Disposition: A | Discharge status: Home / Self Care | Payer: Medicare Other | Source: Ambulatory Visit | Attending: Family Medicine | Admitting: Family Medicine

## 2020-04-19 ENCOUNTER — Other Ambulatory Visit: Payer: Self-pay

## 2020-04-19 DIAGNOSIS — R7989 Other specified abnormal findings of blood chemistry: Secondary | ICD-10-CM | POA: Diagnosis not present

## 2020-08-24 ENCOUNTER — Other Ambulatory Visit: Payer: Self-pay

## 2020-08-24 ENCOUNTER — Ambulatory Visit
Admission: EM | Admit: 2020-08-24 | Discharge: 2020-08-24 | Disposition: A | Discharge status: Home / Self Care | Payer: Medicare HMO | Attending: Family Medicine | Admitting: Family Medicine

## 2020-08-24 DIAGNOSIS — R6883 Chills (without fever): Secondary | ICD-10-CM | POA: Diagnosis not present

## 2020-08-24 DIAGNOSIS — R5383 Other fatigue: Secondary | ICD-10-CM | POA: Diagnosis not present

## 2020-08-24 DIAGNOSIS — Z1152 Encounter for screening for COVID-19: Secondary | ICD-10-CM

## 2020-08-24 DIAGNOSIS — J069 Acute upper respiratory infection, unspecified: Secondary | ICD-10-CM | POA: Diagnosis not present

## 2020-08-24 DIAGNOSIS — R0602 Shortness of breath: Secondary | ICD-10-CM | POA: Diagnosis not present

## 2020-08-24 MED ORDER — PROMETHAZINE-DM 6.25-15 MG/5ML PO SYRP: 5.0000 mL | ORAL_SOLUTION | Freq: Four times a day (QID) | ORAL | 0 refills | Status: DC | PRN

## 2020-08-24 NOTE — Discharge Instructions (Addendum)
I have sent in cough syrup for you to take. This medication can make you sleepy. Do not drive while taking this medication. ° °Your COVID test is pending. You should self quarantine until the test result is back.   ° °Take Tylenol or ibuprofen as needed for fever or discomfort. Rest and keep yourself hydrated.   ° °Follow-up with your primary care provider if your symptoms are not improving.  ° ° °

## 2020-08-24 NOTE — ED Triage Notes (Signed)
Pt presents with c/o chills, cough and some sob for over a wekk

## 2020-08-24 NOTE — ED Provider Notes (Signed)
[facility]   [id number] 08/24/20 Arrival Time: 6213   CC: COVID symptoms  SUBJECTIVE: History from: patient.  Patricia Hamilton is a 65 y.o. female who presents with chills, cough, shortness of breath for over a week. Denies sick exposure to COVID, flu or strep. Denies recent travel. Has negative history of Covid. Has not completed Covid vaccines. Has not taken OTC medications for this. There are no aggravating or alleviating factors. Denies previous symptoms in the past. Denies fever, sinus pain, rhinorrhea, sore throat, wheezing, chest pain, nausea, changes in bowel or bladder habits.    ROS: As per HPI.  All other pertinent ROS negative.     Past Medical History:  Diagnosis Date  . Depression   . Diabetes mellitus without complication (HCC)    no meds  . Hyperlipidemia   . Hypertension    Past Surgical History:  Procedure Laterality Date  . ABDOMINAL SURGERY    . COLONOSCOPY    . COLONOSCOPY WITH PROPOFOL N/A 01/21/2017   Procedure: COLONOSCOPY WITH PROPOFOL;  Surgeon: Lollie Sails, MD;  Location: [facility] ENDOSCOPY;  Service: Endoscopy;  Laterality: N/A;  . ESOPHAGOGASTRODUODENOSCOPY    . Laproscopic Tubal Ligation     No Known Allergies No current facility-administered medications on file prior to encounter.   Current Outpatient Medications on File Prior to Encounter  Medication Sig Dispense Refill  . cetirizine (ZYRTEC) 10 MG chewable tablet Chew 10 mg by mouth daily as needed for allergies.    Marland Kitchen lisinopril (PRINIVIL,ZESTRIL) 20 MG tablet Take 1 tablet (20 mg total) by mouth daily. 30 tablet 0  . lubiprostone (AMITIZA) 8 MCG capsule Take 1 capsule (8 mcg total) by mouth 2 (two) times daily with a meal. 60 capsule 0  . Milnacipran (SAVELLA) 50 MG TABS tablet Take 1 tablet (50 mg total) by mouth 2 (two) times daily. 60 tablet 0  . pantoprazole (PROTONIX) 40 MG tablet Take 1 tablet (40 mg total) by mouth daily. 30 tablet 0  . propranolol (INDERAL) 60 MG tablet  Take 1 tablet (60 mg total) by mouth daily. 30 tablet 0  . sertraline (ZOLOFT) 100 MG tablet Take 1.5 tablets (150 mg total) by mouth daily. 45 tablet 1  . traZODone (DESYREL) 50 MG tablet Take 1 tablet (50 mg total) by mouth at bedtime as needed for sleep. 30 tablet 0   Social History   Socioeconomic History  . Marital status: Married    Spouse name: Not on file  . Number of children: Not on file  . Years of education: Not on file  . Highest education level: Not on file  Occupational History  . Not on file  Tobacco Use  . Smoking status: Never Smoker  . Smokeless tobacco: Never Used  Substance and Sexual Activity  . Alcohol use: No  . Drug use: Not on file  . Sexual activity: Not on file  Other Topics Concern  . Not on file  Social History Narrative  . Not on file   Social Determinants of Health   Financial Resource Strain: Not on file  Food Insecurity: Not on file  Transportation Needs: Not on file  Physical Activity: Not on file  Stress: Not on file  Social Connections: Not on file  Intimate Partner Violence: Not on file   Family History  Problem Relation Age of Onset  . Breast cancer Neg Hx     OBJECTIVE:  Vitals:   08/24/20 1141  BP: 136/81  Pulse: 72  Resp:  18  Temp: 98.3 F (36.8 C)  SpO2: 96%     General appearance: alert; appears fatigued, but nontoxic; speaking in full sentences and tolerating own secretions HEENT: NCAT; Ears: EACs clear, TMs pearly gray; Eyes: PERRL.  EOM grossly intact. Sinuses: nontender; Nose: nares patent with clear rhinorrhea, Throat: oropharynx erythematous, cobblestoning present, tonsils non erythematous or enlarged, uvula midline  Neck: supple without LAD Lungs: unlabored respirations, symmetrical air entry; cough: mild; no respiratory distress; CTAB Heart: regular rate and rhythm.  Radial pulses 2+ symmetrical bilaterally Skin: warm and dry Psychological: alert and cooperative; normal mood and affect  LABS:  No  results found for this or any previous visit (from the past 24 hour(s)).   ASSESSMENT & PLAN:  1. Viral URI with cough   2. Encounter for screening for COVID-19   3. Chills   4. Other fatigue   5. SOB (shortness of breath)     Meds ordered this encounter  Medications  . promethazine-dextromethorphan (PROMETHAZINE-DM) 6.25-15 MG/5ML syrup    Sig: Take 5 mLs by mouth 4 (four) times daily as needed for cough.    Dispense:  118 mL    Refill:  0    Order Specific Question:   Supervising Provider    Answer:   Chase Picket [[id number]]   Promethazine cough syrup prescribed Sedation precautions given Continue supportive care at home COVID and flu testing ordered.  It will take between 2-3 days for test results. Someone will contact you regarding abnormal results.   Work note provided Patient should remain in quarantine until they have received Covid results.  If negative you may resume normal activities (go back to work/school) while practicing hand hygiene, social distance, and mask wearing.  If positive, patient should remain in quarantine for at least 5 days from symptom onset AND greater than 72 hours after symptoms resolution (absence of fever without the use of fever-reducing medication and improvement in respiratory symptoms), whichever is longer Get plenty of rest and push fluids Use OTC zyrtec for nasal congestion, runny nose, and/or sore throat Use OTC flonase for nasal congestion and runny nose Use medications daily for symptom relief Use OTC medications like ibuprofen or tylenol as needed fever or pain Call or go to the ED if you have any new or worsening symptoms such as fever, worsening cough, shortness of breath, chest tightness, chest pain, turning blue, changes in mental status.  Reviewed expectations re: course of current medical issues. Questions answered. Outlined signs and symptoms indicating need for more acute intervention. Patient verbalized understanding. After  Visit Summary given.         Faustino Congress, NP 08/24/20 1203

## 2020-08-26 LAB — NOVEL CORONAVIRUS, NAA: SARS-CoV-2, NAA: DETECTED — AB

## 2020-08-26 LAB — SARS-COV-2, NAA 2 DAY TAT

## 2021-02-28 ENCOUNTER — Other Ambulatory Visit: Payer: Self-pay

## 2021-02-28 ENCOUNTER — Emergency Department
Admission: EM | Admit: 2021-02-28 | Discharge: 2021-02-28 | Disposition: A | Discharge status: Home / Self Care | Payer: Medicare HMO | Attending: Emergency Medicine | Admitting: Emergency Medicine

## 2021-02-28 DIAGNOSIS — Z79899 Other long term (current) drug therapy: Secondary | ICD-10-CM | POA: Diagnosis not present

## 2021-02-28 DIAGNOSIS — T63441A Toxic effect of venom of bees, accidental (unintentional), initial encounter: Secondary | ICD-10-CM | POA: Insufficient documentation

## 2021-02-28 DIAGNOSIS — I10 Essential (primary) hypertension: Secondary | ICD-10-CM | POA: Insufficient documentation

## 2021-02-28 DIAGNOSIS — E119 Type 2 diabetes mellitus without complications: Secondary | ICD-10-CM | POA: Insufficient documentation

## 2021-02-28 MED ORDER — PREDNISONE 20 MG PO TABS
60.0000 mg | ORAL_TABLET | Freq: Once | ORAL | Status: AC
  Administered 2021-02-28: 60 mg via ORAL
  Filled 2021-02-28: qty 3

## 2021-02-28 MED ORDER — DIPHENHYDRAMINE HCL 25 MG PO CAPS
50.0000 mg | ORAL_CAPSULE | Freq: Once | ORAL | Status: AC
  Administered 2021-02-28: 50 mg via ORAL
  Filled 2021-02-28: qty 2

## 2021-02-28 MED ORDER — FAMOTIDINE 20 MG PO TABS
20.0000 mg | ORAL_TABLET | Freq: Once | ORAL | Status: AC
  Administered 2021-02-28: 20 mg via ORAL
  Filled 2021-02-28: qty 1

## 2021-02-28 MED ORDER — PREDNISONE 50 MG PO TABS: 50.0000 mg | ORAL_TABLET | Freq: Every day | ORAL | 0 refills | Status: DC

## 2021-02-28 MED ORDER — CETIRIZINE HCL 10 MG PO TABS: 40.0000 mg | ORAL_TABLET | Freq: Every day | ORAL | 0 refills | Status: DC

## 2021-02-28 NOTE — ED Triage Notes (Signed)
Pt to ED for bee sting to left arm and bilateral ankles around 1130 C/o pain to ankles.  Denies shob Took meds for allergy PTA but unsure of name Pt inNAD No swelling noted

## 2021-02-28 NOTE — ED Provider Notes (Signed)
[facility] Emergency Department Provider Note  ____________________________________________  Time seen: Approximately 5:37 PM  I have reviewed the triage vital signs and the nursing notes.   HISTORY  Chief Complaint Insect Bite    HPI Patricia Hamilton is a 65 y.o. female Who presents the emergency department for being stung by 4 wasp.  Patient states that she has localized pain and swelling to each of the stings bites.  No respiratory or GI complaints.  Patient states that she took an allergy medication which improved symptoms.  Is complaining some ongoing swelling and pain but no systemic complaints.  According to the patient she does not report any history of being stung in the past.       Past Medical History:  Diagnosis Date   Depression    Diabetes mellitus without complication ([place])    no meds   Hyperlipidemia    Hypertension     Patient Active Problem List   Diagnosis Date Noted   Suicide attempt by multiple drug overdose ([place]) 08/19/2018   Major depressive disorder, recurrent episode, severe ([place]) 08/19/2018   Chronic migraine 08/19/2018   History of kidney stones 08/19/2018   Hypertension 08/19/2018   Irritable bowel syndrome 08/19/2018   Prediabetes 08/19/2018   History of depression 08/19/2018   Suicide attempt ([place]) 08/19/2018    Past Surgical History:  Procedure Laterality Date   ABDOMINAL SURGERY     COLONOSCOPY     COLONOSCOPY WITH PROPOFOL N/A 01/21/2017   Procedure: COLONOSCOPY WITH PROPOFOL;  Surgeon: Lollie Sails, MD;  Location: [facility] ENDOSCOPY;  Service: Endoscopy;  Laterality: N/A;   ESOPHAGOGASTRODUODENOSCOPY     Laproscopic Tubal Ligation      Prior to Admission medications   Medication Sig Start Date End Date Taking? Authorizing Provider  cetirizine (ZYRTEC) 10 MG tablet Take 4 tablets (40 mg total) by mouth daily for 3 days. 02/28/21 03/03/21 Yes Calloway Andrus, Charline Bills, PA-C  predniSONE (DELTASONE) 50 MG tablet  Take 1 tablet (50 mg total) by mouth daily with breakfast. 02/28/21  Yes Reginal Wojcicki, Charline Bills, PA-C  lisinopril (PRINIVIL,ZESTRIL) 20 MG tablet Take 1 tablet (20 mg total) by mouth daily. 08/20/18   Clapacs, Madie Reno, MD  lubiprostone (AMITIZA) 8 MCG capsule Take 1 capsule (8 mcg total) by mouth 2 (two) times daily with a meal. 08/20/18   Clapacs, Madie Reno, MD  Milnacipran (SAVELLA) 50 MG TABS tablet Take 1 tablet (50 mg total) by mouth 2 (two) times daily. 08/20/18   Clapacs, Madie Reno, MD  pantoprazole (PROTONIX) 40 MG tablet Take 1 tablet (40 mg total) by mouth daily. 08/20/18   Clapacs, Madie Reno, MD  promethazine-dextromethorphan (PROMETHAZINE-DM) 6.25-15 MG/5ML syrup Take 5 mLs by mouth 4 (four) times daily as needed for cough. 08/24/20   Faustino Congress, NP  propranolol (INDERAL) 60 MG tablet Take 1 tablet (60 mg total) by mouth daily. 08/21/18   Clapacs, Madie Reno, MD  sertraline (ZOLOFT) 100 MG tablet Take 1.5 tablets (150 mg total) by mouth daily. 08/21/18   Clapacs, Madie Reno, MD  traZODone (DESYREL) 50 MG tablet Take 1 tablet (50 mg total) by mouth at bedtime as needed for sleep. 08/20/18   Clapacs, Madie Reno, MD    Allergies Patient has no known allergies.  Family History  Problem Relation Age of Onset   Breast cancer Neg Hx     Social History Social History   Tobacco Use   Smoking status: Never   Smokeless tobacco: Never  Substance  Use Topics   Alcohol use: No     Review of Systems  Constitutional: No fever/chills Eyes: No visual changes. No discharge ENT: No upper respiratory complaints. Cardiovascular: no chest pain. Respiratory: no cough. No SOB. Gastrointestinal: No abdominal pain.  No nausea, no vomiting.  No diarrhea.  No constipation. Musculoskeletal: Negative for musculoskeletal pain. Skin: Multiple bee stings to ankle and L arm Neurological: Negative for headaches, focal weakness or numbness.  10 System ROS otherwise  negative.  ____________________________________________   PHYSICAL EXAM:  VITAL SIGNS: ED Triage Vitals [02/28/21 1612]  Enc Vitals Group     BP (!) 150/78     Pulse Rate 70     Resp 18     Temp 98 F (36.7 C)     Temp Source Oral     SpO2 98 %     Weight 165 lb (74.8 kg)     Height 5' (1.524 m)     Head Circumference      Peak Flow      Pain Score 10     Pain Loc      Pain Edu?      Excl. in [place]?      Constitutional: Alert and oriented. Well appearing and in no acute distress. Eyes: Conjunctivae are normal. PERRL. EOMI. Head: Atraumatic. ENT:      Ears:       Nose: No congestion/rhinnorhea.      Mouth/Throat: Mucous membranes are moist.  Neck: No stridor.    Cardiovascular: Normal rate, regular rhythm. Normal S1 and S2.  Good peripheral circulation. Respiratory: Normal respiratory effort without tachypnea or retractions. Lungs CTAB. Good air entry to the bases with no decreased or absent breath sounds. Musculoskeletal: Full range of motion to all extremities. No gross deformities appreciated. Neurologic:  Normal speech and language. No gross focal neurologic deficits are appreciated.  Skin:  Skin is warm, dry and intact. No rash noted.  For scattered lesions noted consistent with bee/wasp thing.  Localized erythema and tenderness with no generalized hives or wheals. Psychiatric: Mood and affect are normal. Speech and behavior are normal. Patient exhibits appropriate insight and judgement.   ____________________________________________   LABS (all labs ordered are listed, but only abnormal results are displayed)  Labs Reviewed - No data to display ____________________________________________  EKG   ____________________________________________  RADIOLOGY   No results found.  ____________________________________________    PROCEDURES  Procedure(s) performed:    Procedures    Medications  diphenhydrAMINE (BENADRYL) capsule 50 mg (has no  administration in time range)  predniSONE (DELTASONE) tablet 60 mg (has no administration in time range)  famotidine (PEPCID) tablet 20 mg (has no administration in time range)     ____________________________________________   INITIAL IMPRESSION / ASSESSMENT AND PLAN / ED COURSE  Pertinent labs & imaging results that were available during my care of the patient were reviewed by me and considered in my medical decision making (see chart for details).  Review of the [place] CSRS was performed in accordance of the [place] prior to dispensing any controlled drugs.           Patient's diagnosis is consistent with bee stings.  Patient presented to the emergency department after being stung 4 times by wasps.  Patient had localized erythema and edema and had taken an unknown allergy medicine prior to arrival.  No systemic complaints currently concerning for anaphylaxis.  Patient will be given dose of steroid, Benadryl and famotidine here.  Patient will have antihistamine and  short course of steroid at home for additional symptom improvement.  Return precautions discussed with patient.  Otherwise follow-up primary care as needed..  Patient is given ED precautions to return to the ED for any worsening or new symptoms.     ____________________________________________  FINAL CLINICAL IMPRESSION(S) / ED DIAGNOSES  Final diagnoses:  Bee sting, accidental or unintentional, initial encounter      NEW MEDICATIONS STARTED DURING THIS VISIT:  ED Discharge Orders          Ordered    predniSONE (DELTASONE) 50 MG tablet  Daily with breakfast        02/28/21 1805    cetirizine (ZYRTEC) 10 MG tablet  Daily        02/28/21 1805                This chart was dictated using voice recognition software/Dragon. Despite best efforts to proofread, errors can occur which can change the meaning. Any change was purely unintentional.    Darletta Moll, PA-C 02/28/21 1817    Vladimir Crofts,  MD 03/01/21 [phone]

## 2021-03-03 ENCOUNTER — Other Ambulatory Visit: Payer: Self-pay | Admitting: Family Medicine

## 2021-03-03 DIAGNOSIS — Z1231 Encounter for screening mammogram for malignant neoplasm of breast: Secondary | ICD-10-CM

## 2021-05-03 ENCOUNTER — Other Ambulatory Visit: Payer: Self-pay

## 2021-05-03 ENCOUNTER — Ambulatory Visit
Admission: RE | Admit: 2021-05-03 | Discharge: 2021-05-03 | Disposition: A | Discharge status: Home / Self Care | Payer: Medicare HMO | Source: Ambulatory Visit | Attending: Family Medicine | Admitting: Family Medicine

## 2021-05-03 DIAGNOSIS — Z1231 Encounter for screening mammogram for malignant neoplasm of breast: Secondary | ICD-10-CM | POA: Insufficient documentation

## 2021-10-04 ENCOUNTER — Other Ambulatory Visit: Payer: Self-pay | Admitting: Family Medicine

## 2022-02-08 ENCOUNTER — Other Ambulatory Visit: Payer: Self-pay | Admitting: Nurse Practitioner

## 2022-02-08 DIAGNOSIS — R7989 Other specified abnormal findings of blood chemistry: Secondary | ICD-10-CM

## 2022-02-16 ENCOUNTER — Ambulatory Visit
Admission: RE | Admit: 2022-02-16 | Discharge: 2022-02-16 | Disposition: A | Discharge status: Home / Self Care | Payer: Medicare HMO | Source: Ambulatory Visit | Attending: Nurse Practitioner | Admitting: Nurse Practitioner

## 2022-02-16 DIAGNOSIS — R7989 Other specified abnormal findings of blood chemistry: Secondary | ICD-10-CM | POA: Diagnosis present

## 2022-10-30 ENCOUNTER — Ambulatory Visit (INDEPENDENT_AMBULATORY_CARE_PROVIDER_SITE_OTHER): Payer: 59 | Admitting: Internal Medicine

## 2022-10-30 ENCOUNTER — Encounter: Payer: Self-pay | Admitting: Internal Medicine

## 2022-10-30 VITALS — BP 125/69 | HR 82 | Ht 60.0 in | Wt 164.6 lb

## 2022-10-30 DIAGNOSIS — E119 Type 2 diabetes mellitus without complications: Secondary | ICD-10-CM

## 2022-10-30 DIAGNOSIS — Z1329 Encounter for screening for other suspected endocrine disorder: Secondary | ICD-10-CM | POA: Diagnosis not present

## 2022-10-30 DIAGNOSIS — E785 Hyperlipidemia, unspecified: Secondary | ICD-10-CM | POA: Diagnosis not present

## 2022-10-30 DIAGNOSIS — Z1321 Encounter for screening for nutritional disorder: Secondary | ICD-10-CM

## 2022-10-30 DIAGNOSIS — I10 Essential (primary) hypertension: Secondary | ICD-10-CM | POA: Diagnosis not present

## 2022-10-30 DIAGNOSIS — Z0001 Encounter for general adult medical examination with abnormal findings: Secondary | ICD-10-CM | POA: Diagnosis not present

## 2022-10-30 DIAGNOSIS — E1169 Type 2 diabetes mellitus with other specified complication: Secondary | ICD-10-CM | POA: Insufficient documentation

## 2022-10-30 DIAGNOSIS — M542 Cervicalgia: Secondary | ICD-10-CM

## 2022-10-30 DIAGNOSIS — Z8669 Personal history of other diseases of the nervous system and sense organs: Secondary | ICD-10-CM

## 2022-10-30 DIAGNOSIS — Z1159 Encounter for screening for other viral diseases: Secondary | ICD-10-CM

## 2022-10-30 DIAGNOSIS — G4700 Insomnia, unspecified: Secondary | ICD-10-CM

## 2022-10-30 DIAGNOSIS — G8929 Other chronic pain: Secondary | ICD-10-CM | POA: Insufficient documentation

## 2022-10-30 DIAGNOSIS — F332 Major depressive disorder, recurrent severe without psychotic features: Secondary | ICD-10-CM

## 2022-10-30 DIAGNOSIS — M549 Dorsalgia, unspecified: Secondary | ICD-10-CM

## 2022-10-30 DIAGNOSIS — Z13820 Encounter for screening for osteoporosis: Secondary | ICD-10-CM

## 2022-10-30 DIAGNOSIS — Z1231 Encounter for screening mammogram for malignant neoplasm of breast: Secondary | ICD-10-CM

## 2022-10-30 DIAGNOSIS — J302 Other seasonal allergic rhinitis: Secondary | ICD-10-CM

## 2022-10-30 MED ORDER — CYCLOBENZAPRINE HCL 5 MG PO TABS: 5.0000 mg | ORAL_TABLET | Freq: Three times a day (TID) | ORAL | 1 refills | Status: DC | PRN

## 2022-10-30 MED ORDER — SUMATRIPTAN SUCCINATE 25 MG PO TABS: 25.0000 mg | ORAL_TABLET | ORAL | 0 refills | Status: DC | PRN

## 2022-10-30 NOTE — Assessment & Plan Note (Signed)
History of migraine headaches.  Currently prescribed propranolol 60 mg daily for migraine prevention.  Her acute concern today is migraines that she had her first migraine of the calendar year last Saturday (3/30). -Add Imitrex for as needed migraine relief

## 2022-10-30 NOTE — Assessment & Plan Note (Signed)
She endorses a history of of T2DM and is currently prescribed Ozempic 0.5 mg weekly. -Repeat A1c and urine microalbumin/creatinine ratio ordered today -Continue Ozempic 0.5 mg weekly

## 2022-10-30 NOTE — Assessment & Plan Note (Addendum)
History of MDD with prior history of suicide attempt.  She is currently prescribed Zoloft 100 mg daily and states that this is effective in stabilizing her mood. -No medication changes today

## 2022-10-30 NOTE — Assessment & Plan Note (Signed)
Currently prescribed trazodone 50 mg nightly. -No medication changes today

## 2022-10-30 NOTE — Assessment & Plan Note (Signed)
Presenting today to establish care.  Available records and labs been reviewed. -Records requested from her previous PCP -Baseline labs ordered today, including one-time HCV screening -DEXA scan ordered today -Mammogram ordered today -We will tentatively plan for follow-up in 3 months

## 2022-10-30 NOTE — Assessment & Plan Note (Signed)
She is currently prescribed atorvastatin 20 mg daily. -Repeat lipid panel ordered today 
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
## 2022-10-30 NOTE — Assessment & Plan Note (Signed)
Using Flonase for as needed allergy relief.  No medication changes today.

## 2022-10-30 NOTE — Assessment & Plan Note (Signed)
She is currently prescribed propranolol 60 mg daily and lisinopril 20 mg daily for treatment of hypertension.  BP today is 125/69. -No medication changes today
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
## 2022-11-01 ENCOUNTER — Encounter: Payer: Self-pay | Admitting: Internal Medicine

## 2022-11-01 LAB — MICROALBUMIN / CREATININE URINE RATIO
Creatinine, Urine: 101.6 mg/dL
Microalb/Creat Ratio: 19 mg/g creat (ref 0–29)
Microalbumin, Urine: 19.4 ug/mL

## 2022-11-01 LAB — CBC WITH DIFFERENTIAL/PLATELET
Basophils Absolute: 0.1 10*3/uL (ref 0.0–0.2)
Basos: 1 %
EOS (ABSOLUTE): 0.3 10*3/uL (ref 0.0–0.4)
Eos: 4 %
Hematocrit: 38.1 % (ref 34.0–46.6)
Hemoglobin: 12.4 g/dL (ref 11.1–15.9)
Immature Grans (Abs): 0 10*3/uL (ref 0.0–0.1)
Immature Granulocytes: 0 %
Lymphocytes Absolute: 2.8 10*3/uL (ref 0.7–3.1)
Lymphs: 35 %
MCH: 27.5 pg (ref 26.6–33.0)
MCHC: 32.5 g/dL (ref 31.5–35.7)
MCV: 85 fL (ref 79–97)
Monocytes Absolute: 0.7 10*3/uL (ref 0.1–0.9)
Monocytes: 9 %
Neutrophils Absolute: 4 10*3/uL (ref 1.4–7.0)
Neutrophils: 51 %
Platelets: 231 10*3/uL (ref 150–450)
RBC: 4.51 x10E6/uL (ref 3.77–5.28)
RDW: 13.4 % (ref 11.7–15.4)
WBC: 7.8 10*3/uL (ref 3.4–10.8)

## 2022-11-01 LAB — CMP14+EGFR
ALT: 32 IU/L (ref 0–32)
AST: 33 IU/L (ref 0–40)
Albumin/Globulin Ratio: 1.5 (ref 1.2–2.2)
Albumin: 4.4 g/dL (ref 3.9–4.9)
Alkaline Phosphatase: 95 IU/L (ref 44–121)
BUN/Creatinine Ratio: 15 (ref 12–28)
BUN: 10 mg/dL (ref 8–27)
Bilirubin Total: 0.5 mg/dL (ref 0.0–1.2)
CO2: 27 mmol/L (ref 20–29)
Calcium: 9.4 mg/dL (ref 8.7–10.3)
Chloride: 102 mmol/L (ref 96–106)
Creatinine, Ser: 0.68 mg/dL (ref 0.57–1.00)
Globulin, Total: 2.9 g/dL (ref 1.5–4.5)
Glucose: 95 mg/dL (ref 70–99)
Potassium: 4.3 mmol/L (ref 3.5–5.2)
Sodium: 141 mmol/L (ref 134–144)
Total Protein: 7.3 g/dL (ref 6.0–8.5)
eGFR: 96 mL/min/{1.73_m2} (ref >59)

## 2022-11-01 LAB — TSH+FREE T4
Free T4: 1.16 ng/dL (ref 0.82–1.77)
TSH: 1.44 u[IU]/mL (ref 0.450–4.500)

## 2022-11-01 LAB — LIPID PANEL
Chol/HDL Ratio: 1.6 ratio (ref 0.0–4.4)
Cholesterol, Total: 108 mg/dL (ref 100–199)
HDL: 66 mg/dL (ref >39)
LDL Chol Calc (NIH): 23 mg/dL (ref 0–99)
Triglycerides: 108 mg/dL (ref 0–149)
VLDL Cholesterol Cal: 19 mg/dL (ref 5–40)

## 2022-11-01 LAB — B12 AND FOLATE PANEL
Folate: 9.1 ng/mL (ref >3.0)
Vitamin B-12: 685 pg/mL (ref 232–1245)

## 2022-11-01 LAB — HEMOGLOBIN A1C
Est. average glucose Bld gHb Est-mCnc: 123 mg/dL
Hgb A1c MFr Bld: 5.9 % — ABNORMAL HIGH (ref 4.8–5.6)

## 2022-11-01 LAB — VITAMIN D 25 HYDROXY (VIT D DEFICIENCY, FRACTURES): Vit D, 25-Hydroxy: 33.1 ng/mL (ref 30.0–100.0)

## 2022-11-01 LAB — HCV INTERPRETATION

## 2022-11-01 LAB — HCV AB W REFLEX TO QUANT PCR: HCV Ab: NONREACTIVE

## 2022-11-02 ENCOUNTER — Other Ambulatory Visit: Payer: Self-pay

## 2022-11-02 DIAGNOSIS — Z8669 Personal history of other diseases of the nervous system and sense organs: Secondary | ICD-10-CM

## 2022-11-02 DIAGNOSIS — G8929 Other chronic pain: Secondary | ICD-10-CM

## 2022-11-02 MED ORDER — SUMATRIPTAN SUCCINATE 25 MG PO TABS: 25.0000 mg | ORAL_TABLET | ORAL | 0 refills | Status: AC | PRN

## 2022-11-02 MED ORDER — CYCLOBENZAPRINE HCL 5 MG PO TABS: 5.0000 mg | ORAL_TABLET | Freq: Three times a day (TID) | ORAL | 1 refills | Status: AC | PRN

## 2022-12-03 ENCOUNTER — Ambulatory Visit (HOSPITAL_COMMUNITY): Payer: 59

## 2022-12-03 ENCOUNTER — Other Ambulatory Visit (HOSPITAL_COMMUNITY): Payer: 59

## 2022-12-25 ENCOUNTER — Other Ambulatory Visit: Payer: Self-pay

## 2022-12-25 DIAGNOSIS — E785 Hyperlipidemia, unspecified: Secondary | ICD-10-CM

## 2022-12-25 DIAGNOSIS — I10 Essential (primary) hypertension: Secondary | ICD-10-CM

## 2022-12-25 DIAGNOSIS — G8929 Other chronic pain: Secondary | ICD-10-CM

## 2022-12-25 DIAGNOSIS — F332 Major depressive disorder, recurrent severe without psychotic features: Secondary | ICD-10-CM

## 2022-12-25 DIAGNOSIS — G4700 Insomnia, unspecified: Secondary | ICD-10-CM

## 2022-12-25 DIAGNOSIS — E119 Type 2 diabetes mellitus without complications: Secondary | ICD-10-CM

## 2022-12-25 MED ORDER — LISINOPRIL 20 MG PO TABS: 20.0000 mg | ORAL_TABLET | Freq: Every day | ORAL | 1 refills | Status: DC

## 2022-12-25 MED ORDER — SERTRALINE HCL 100 MG PO TABS: 150.0000 mg | ORAL_TABLET | Freq: Every day | ORAL | 0 refills | Status: DC

## 2022-12-25 MED ORDER — ATORVASTATIN CALCIUM 20 MG PO TABS: 20.0000 mg | ORAL_TABLET | Freq: Every day | ORAL | 1 refills | Status: DC

## 2022-12-25 MED ORDER — OZEMPIC (0.25 OR 0.5 MG/DOSE) 2 MG/3ML ~~LOC~~ SOPN: 0.5000 mg | PEN_INJECTOR | SUBCUTANEOUS | 1 refills | Status: DC

## 2022-12-25 MED ORDER — PANTOPRAZOLE SODIUM 40 MG PO TBEC: 40.0000 mg | DELAYED_RELEASE_TABLET | Freq: Every day | ORAL | 2 refills | Status: DC

## 2022-12-25 MED ORDER — PROPRANOLOL HCL 60 MG PO TABS: 60.0000 mg | ORAL_TABLET | Freq: Every day | ORAL | 2 refills | Status: DC

## 2022-12-25 MED ORDER — TRAZODONE HCL 50 MG PO TABS: 50.0000 mg | ORAL_TABLET | Freq: Every evening | ORAL | 0 refills | Status: DC | PRN

## 2023-01-20 ENCOUNTER — Other Ambulatory Visit: Payer: Self-pay | Admitting: Internal Medicine

## 2023-01-20 DIAGNOSIS — F332 Major depressive disorder, recurrent severe without psychotic features: Secondary | ICD-10-CM

## 2023-01-20 DIAGNOSIS — G4700 Insomnia, unspecified: Secondary | ICD-10-CM

## 2023-01-30 ENCOUNTER — Ambulatory Visit: Payer: 59 | Admitting: Family Medicine

## 2023-02-04 ENCOUNTER — Ambulatory Visit (INDEPENDENT_AMBULATORY_CARE_PROVIDER_SITE_OTHER): Payer: 59 | Admitting: Family Medicine

## 2023-02-04 ENCOUNTER — Encounter: Payer: Self-pay | Admitting: Family Medicine

## 2023-02-04 VITALS — BP 109/60 | HR 87 | Wt 163.0 lb

## 2023-02-04 DIAGNOSIS — I10 Essential (primary) hypertension: Secondary | ICD-10-CM

## 2023-02-04 DIAGNOSIS — K625 Hemorrhage of anus and rectum: Secondary | ICD-10-CM

## 2023-02-04 DIAGNOSIS — Z1231 Encounter for screening mammogram for malignant neoplasm of breast: Secondary | ICD-10-CM | POA: Diagnosis not present

## 2023-02-04 DIAGNOSIS — R1084 Generalized abdominal pain: Secondary | ICD-10-CM

## 2023-02-04 DIAGNOSIS — G4700 Insomnia, unspecified: Secondary | ICD-10-CM

## 2023-02-04 DIAGNOSIS — R109 Unspecified abdominal pain: Secondary | ICD-10-CM | POA: Insufficient documentation

## 2023-02-04 DIAGNOSIS — E785 Hyperlipidemia, unspecified: Secondary | ICD-10-CM

## 2023-02-04 MED ORDER — LISINOPRIL 20 MG PO TABS: 20.0000 mg | ORAL_TABLET | Freq: Every day | ORAL | 1 refills | Status: DC

## 2023-02-04 MED ORDER — PANTOPRAZOLE SODIUM 40 MG PO TBEC
40.0000 mg | DELAYED_RELEASE_TABLET | Freq: Every day | ORAL | 2 refills | Status: DC
Start: 2023-02-04 — End: 2023-05-06

## 2023-02-04 MED ORDER — PROPRANOLOL HCL 60 MG PO TABS
60.0000 mg | ORAL_TABLET | Freq: Every day | ORAL | 1 refills | Status: DC
Start: 2023-02-04 — End: 2023-05-13

## 2023-02-04 MED ORDER — VENTOLIN HFA 108 (90 BASE) MCG/ACT IN AERS: 2.0000 | INHALATION_SPRAY | Freq: Four times a day (QID) | RESPIRATORY_TRACT | 6 refills | Status: AC | PRN

## 2023-02-04 MED ORDER — TRAZODONE HCL 50 MG PO TABS
50.00 mg | ORAL_TABLET | Freq: Every evening | ORAL | 11 refills | Status: AC | PRN
Start: 2023-02-04 — End: ?

## 2023-02-04 NOTE — Patient Instructions (Signed)
        Great to see you today.  I have refilled the medication(s) we provide.    - Please take medications as prescribed. - Follow up with your primary health provider if any health concerns arises. - If symptoms worsen please contact your primary care provider and/or visit the emergency department.  

## 2023-02-04 NOTE — Progress Notes (Signed)
Patient Office Visit   Subjective   Patient ID: Patricia Hamilton, female    DOB: 1956/02/19  Age: 67 y.o. MRN: [id number]  CC:  Chief Complaint  Patient presents with   Hyperlipidemia   Hypertension   Pain    Patient complains of L breast pain that radiates down her abdomen, and pain with bowel movements to where it feels like her intestines are coming out.     HPI Patricia Hamilton 67 year old female, presents to the clinic for abdominal with rectal bleeding started a few weeks ago. She  has a past medical history of Depression, Diabetes mellitus without complication (HCC), Hyperlipidemia, and Hypertension.  Abdominal Pain This is a new problem. The onset quality is sudden. Abdominal pain occurs intermittently and  has been gradually worsening since onset. The pain is located in the upper quadrant and  generalized abdominal region. The pain is at a severity of 9/10. The quality of the pain is colicky, sharp and a sensation of fullness. Associated symptoms include diarrhea and hematochezia, which has stopped.  Pertinent negatives include no constipation, fever or frequency. The pain is aggravated by eating, movement and bowel movement. The pain is relieved by Liquids and certain positions. Treatments tried: Catering manager. The treatment provided moderate relief.     Outpatient Encounter Medications as of 02/04/2023  Medication Sig   atorvastatin (LIPITOR) 20 MG tablet Take 1 tablet (20 mg total) by mouth daily.   cetirizine (ZYRTEC) 10 MG tablet Take by mouth.   cyclobenzaprine (FLEXERIL) 5 MG tablet Take 1 tablet (5 mg total) by mouth 3 (three) times daily as needed for muscle spasms.   sertraline (ZOLOFT) 100 MG tablet TAKE 1 AND 1/2 TABLETS BY MOUTH  DAILY   SUMAtriptan (IMITREX) 25 MG tablet Take 1 tablet (25 mg total) by mouth every 2 (two) hours as needed for migraine. May repeat in 2 hours if headache persists or recurs.   [DISCONTINUED] lisinopril (ZESTRIL) 20 MG tablet Take 1  tablet (20 mg total) by mouth daily.   [DISCONTINUED] pantoprazole (PROTONIX) 40 MG tablet Take 1 tablet (40 mg total) by mouth daily.   [DISCONTINUED] propranolol (INDERAL) 60 MG tablet Take 1 tablet (60 mg total) by mouth daily.   [DISCONTINUED] traZODone (DESYREL) 50 MG tablet TAKE 1 TABLET BY MOUTH AT  BEDTIME AS NEEDED FOR SLEEP   [DISCONTINUED] VENTOLIN HFA 108 (90 Base) MCG/ACT inhaler Inhale into the lungs.   lisinopril (ZESTRIL) 20 MG tablet Take 1 tablet (20 mg total) by mouth daily.   pantoprazole (PROTONIX) 40 MG tablet Take 1 tablet (40 mg total) by mouth daily.   propranolol (INDERAL) 60 MG tablet Take 1 tablet (60 mg total) by mouth daily.   traZODone (DESYREL) 50 MG tablet Take 1 tablet (50 mg total) by mouth at bedtime as needed. for sleep   VENTOLIN HFA 108 (90 Base) MCG/ACT inhaler Inhale 2 puffs into the lungs every 6 (six) hours as needed for wheezing or shortness of breath.   No facility-administered encounter medications on file as of 02/04/2023.    Past Surgical History:  Procedure Laterality Date   ABDOMINAL SURGERY     COLONOSCOPY     COLONOSCOPY WITH PROPOFOL N/A 01/21/2017   Procedure: COLONOSCOPY WITH PROPOFOL;  Surgeon: Christena Deem, MD;  Location: [facility] ENDOSCOPY;  Service: Endoscopy;  Laterality: N/A;   ESOPHAGOGASTRODUODENOSCOPY     Laproscopic Tubal Ligation      Review of Systems  Constitutional:  Negative for chills  and fever.  Eyes:  Negative for blurred vision.  Respiratory:  Negative for shortness of breath.   Cardiovascular:  Negative for chest pain.  Gastrointestinal:  Positive for abdominal pain, blood in stool and diarrhea. Negative for constipation, heartburn, melena, nausea and vomiting.  Genitourinary:  Negative for dysuria.  Musculoskeletal:  Negative for myalgias.  Neurological:  Negative for dizziness and headaches.      Objective    BP 109/60   Pulse 87   Wt 163 lb (73.9 kg)   SpO2 94%   BMI 31.83 kg/m   Physical  Exam Vitals reviewed.  Constitutional:      General: She is not in acute distress.    Appearance: Normal appearance. She is not ill-appearing, toxic-appearing or diaphoretic.  HENT:     Head: Normocephalic.  Eyes:     General:        Right eye: No discharge.        Left eye: No discharge.     Conjunctiva/sclera: Conjunctivae normal.  Cardiovascular:     Rate and Rhythm: Normal rate.     Pulses: Normal pulses.     Heart sounds: Normal heart sounds.  Pulmonary:     Effort: Pulmonary effort is normal. No respiratory distress.     Breath sounds: Normal breath sounds.  Abdominal:     General: Bowel sounds are normal. There is no distension.     Palpations: Abdomen is soft.     Tenderness: There is abdominal tenderness. There is no right CVA tenderness, left CVA tenderness or guarding.  Musculoskeletal:        General: Normal range of motion.     Cervical back: Normal range of motion.  Skin:    General: Skin is warm and dry.     Capillary Refill: Capillary refill takes less than 2 seconds.  Neurological:     General: No focal deficit present.     Mental Status: She is alert and oriented to person, place, and time.     Coordination: Coordination normal.     Gait: Gait normal.  Psychiatric:        Mood and Affect: Mood normal.        Behavior: Behavior normal.       Assessment & Plan:  Generalized abdominal pain Assessment & Plan: Abdominal pain with rectal bleeding CBC ordered CT abdomen pelvis ordered for further evaluation  Advise Increase oral fluid intake. Bland diet as tolerated. Avoid fluids that have a lot sugar or caffeine, Avoid spicy or fatty food. Avoid alcohol. Can take OTC tylenol for pain. Follow-up in unable to keep food/fluid down x 24 hours, dizziness, fevers, worsening or persistent symptoms to present to ED or contact primary care provider. Patient verbalizes understanding regarding plan of care and all questions answered.   Orders: -     CT ABDOMEN PELVIS  WO CONTRAST; Future  Rectal bleeding -     CT ABDOMEN PELVIS WO CONTRAST; Future -     CBC with Differential/Platelet  Screening mammogram for breast cancer -     Digital Screening Mammogram, Left and Right; Future  Hypertension, unspecified type -     Lisinopril; Take 1 tablet (20 mg total) by mouth daily.  Dispense: 90 tablet; Refill: 1 -     Pantoprazole Sodium; Take 1 tablet (40 mg total) by mouth daily.  Dispense: 30 tablet; Refill: 2  Hyperlipidemia, unspecified hyperlipidemia type -     Propranolol HCl; Take 1 tablet (60 mg total) by mouth  daily.  Dispense: 90 tablet; Refill: 1  Insomnia, unspecified type -     traZODone HCl; Take 1 tablet (50 mg total) by mouth at bedtime as needed. for sleep  Dispense: 30 tablet; Refill: 11  Other orders -     Ventolin HFA; Inhale 2 puffs into the lungs every 6 (six) hours as needed for wheezing or shortness of breath.  Dispense: 8 g; Refill: 6    Return in about 3 months (around 05/07/2023), or if symptoms worsen or fail to improve, for routine labs, hypertension, hyperlipidemia, prediabetes.   Cruzita Lederer Newman Nip, FNP

## 2023-02-04 NOTE — Assessment & Plan Note (Signed)
Abdominal pain with rectal bleeding CBC ordered CT abdomen pelvis ordered for further evaluation  Advise Increase oral fluid intake. Bland diet as tolerated. Avoid fluids that have a lot sugar or caffeine, Avoid spicy or fatty food. Avoid alcohol. Can take OTC tylenol for pain. Follow-up in unable to keep food/fluid down x 24 hours, dizziness, fevers, worsening or persistent symptoms to present to ED or contact primary care provider. Patient verbalizes understanding regarding plan of care and all questions answered.

## 2023-02-05 ENCOUNTER — Ambulatory Visit (HOSPITAL_COMMUNITY)
Admission: RE | Admit: 2023-02-05 | Discharge: 2023-02-05 | Disposition: A | Discharge status: Home / Self Care | Payer: 59 | Source: Ambulatory Visit | Attending: Family Medicine | Admitting: Family Medicine

## 2023-02-05 DIAGNOSIS — K625 Hemorrhage of anus and rectum: Secondary | ICD-10-CM | POA: Diagnosis present

## 2023-02-05 DIAGNOSIS — R1084 Generalized abdominal pain: Secondary | ICD-10-CM | POA: Diagnosis not present

## 2023-02-08 ENCOUNTER — Other Ambulatory Visit: Payer: Self-pay | Admitting: Internal Medicine

## 2023-02-08 DIAGNOSIS — E119 Type 2 diabetes mellitus without complications: Secondary | ICD-10-CM

## 2023-02-13 NOTE — Progress Notes (Signed)
Hello Patricia Hamilton, spanish speaking patient,  Please inform patient CT results normal, just showed"Colon diverticulosis  Colon diverticulosis is a condition where small, bulging pouches (called diverticula) form in the walls of the colon (large intestine). It's common, especially as people get older, and often doesn't cause any symptoms.  What to Eat High-Fiber Foods:  Fruits: Apples, pears, berries Vegetables: Broccoli, carrots, peas Whole Grains: Whole wheat bread, brown rice, oatmeal Legumes: Beans, lentils Plenty of Water: Drinking enough water helps fiber do its job and keeps everything moving smoothly in your digestive system.  Avoid:  Low-Fiber Foods: Processed foods like white bread, white rice, and sugary snacks. Large Meals: Eat smaller, more frequent meals instead of large ones.

## 2023-02-24 ENCOUNTER — Other Ambulatory Visit: Payer: Self-pay | Admitting: Internal Medicine

## 2023-02-24 DIAGNOSIS — E785 Hyperlipidemia, unspecified: Secondary | ICD-10-CM

## 2023-04-21 ENCOUNTER — Other Ambulatory Visit: Payer: Self-pay | Admitting: Family Medicine

## 2023-04-21 DIAGNOSIS — I10 Essential (primary) hypertension: Secondary | ICD-10-CM

## 2023-05-05 ENCOUNTER — Other Ambulatory Visit: Payer: Self-pay | Admitting: Family Medicine

## 2023-05-05 DIAGNOSIS — I10 Essential (primary) hypertension: Secondary | ICD-10-CM

## 2023-05-06 ENCOUNTER — Ambulatory Visit: Payer: 59 | Admitting: Family Medicine

## 2023-05-12 ENCOUNTER — Other Ambulatory Visit: Payer: Self-pay | Admitting: Family Medicine

## 2023-05-12 DIAGNOSIS — E785 Hyperlipidemia, unspecified: Secondary | ICD-10-CM

## 2023-05-14 ENCOUNTER — Encounter: Payer: Self-pay | Admitting: Family Medicine

## 2023-11-07 ENCOUNTER — Other Ambulatory Visit: Payer: Self-pay | Admitting: Family Medicine

## 2023-11-07 DIAGNOSIS — E785 Hyperlipidemia, unspecified: Secondary | ICD-10-CM

## 2023-11-21 ENCOUNTER — Encounter: Payer: Self-pay | Admitting: Gastroenterology

## 2023-11-28 ENCOUNTER — Encounter: Payer: Self-pay | Admitting: Gastroenterology

## 2023-11-28 NOTE — H&P (Signed)
 Pre-Procedure H&P   Patient ID: Patricia Hamilton is a 68 y.o. female.  Gastroenterology Provider: Quintin Buckle, DO  Referring Provider: Laquetta Plank, PA PCP: Rosanna Comment, FNP  Date: 11/29/2023  HPI Ms. Patricia Hamilton is a 68 y.o. female who presents today for Esophagogastroduodenoscopy and Colonoscopy for GERD, dysphagia, rectal bleeding .  Patient is a native [place] speaker.  Information is garnered via chart review and medical Spanish interpreter Mylinda Asa)  Patient has had upper esophageal sticking of solid foods.  Notes regurgitation.  No issues with liquids or pills or odynophagia.  Family history of gastric cancer in her brother  She has noted bright red blood per rectum.  Occasional rectal pain with defecation.  This is helped by over the counter creams and in the past Anusol suppositories.  Reports daily bowel movement  CT in July 2024 negative for source of abdominal pain  Last colonoscopy June 2018 demonstrated left-sided diverticulosis and hyperplastic polyps  Ozempic  has been held for the procedure today  Hemoglobin 12.4 MCV 85 platelets 231,000   Past Medical History:  Diagnosis Date   Depression    Diabetes mellitus without complication (HCC)    no meds   Diverticulosis    Headache    Hyperlipidemia    Hyperplastic colon polyp    Hypertension    Insomnia     Past Surgical History:  Procedure Laterality Date   ABDOMINAL SURGERY     COLONOSCOPY     COLONOSCOPY WITH PROPOFOL  N/A 01/21/2017   Procedure: COLONOSCOPY WITH PROPOFOL ;  Surgeon: Deveron Fly, MD;  Location: [facility] ENDOSCOPY;  Service: Endoscopy;  Laterality: N/A;   ESOPHAGOGASTRODUODENOSCOPY     Laproscopic Tubal Ligation     TUBAL LIGATION      Family History Brother-gastric cancer No other h/o GI disease or malignancy  Review of Systems  Constitutional:  Negative for activity change, appetite change, chills, diaphoresis, fatigue, fever and unexpected weight  change.  HENT:  Positive for trouble swallowing. Negative for voice change.   Respiratory:  Negative for shortness of breath and wheezing.   Cardiovascular:  Negative for chest pain, palpitations and leg swelling.  Gastrointestinal:  Positive for abdominal pain, anal bleeding and blood in stool. Negative for abdominal distention, constipation, diarrhea, nausea, rectal pain and vomiting.  Musculoskeletal:  Negative for arthralgias and myalgias.  Skin:  Negative for color change and pallor.  Neurological:  Negative for dizziness, syncope and weakness.  Psychiatric/Behavioral:  Negative for confusion.   All other systems reviewed and are negative.    Medications No current facility-administered medications on file prior to encounter.   Current Outpatient Medications on File Prior to Encounter  Medication Sig Dispense Refill   lisinopril  (ZESTRIL ) 20 MG tablet TAKE 1 TABLET BY MOUTH DAILY 100 tablet 2   pantoprazole  (PROTONIX ) 40 MG tablet TAKE 1 TABLET BY MOUTH DAILY 90 tablet 3   propranolol  (INDERAL ) 60 MG tablet TAKE 1 TABLET BY MOUTH DAILY 100 tablet 2   sertraline  (ZOLOFT ) 100 MG tablet TAKE 1 AND 1/2 TABLETS BY MOUTH  DAILY 45 tablet 11   traZODone  (DESYREL ) 50 MG tablet Take 1 tablet (50 mg total) by mouth at bedtime as needed. for sleep 30 tablet 11   VENTOLIN  HFA 108 (90 Base) MCG/ACT inhaler Inhale 2 puffs into the lungs every 6 (six) hours as needed for wheezing or shortness of breath. 8 g 6   cetirizine  (ZYRTEC ) 10 MG tablet Take by mouth.  cyclobenzaprine  (FLEXERIL ) 5 MG tablet Take 1 tablet (5 mg total) by mouth 3 (three) times daily as needed for muscle spasms. 30 tablet 1   OZEMPIC , 0.25 OR 0.5 MG/DOSE, 2 MG/3ML SOPN INJECT SUBCUTANEOUSLY 0.5 MG  EVERY WEEK 6 mL 5   SUMAtriptan  (IMITREX ) 25 MG tablet Take 1 tablet (25 mg total) by mouth every 2 (two) hours as needed for migraine. May repeat in 2 hours if headache persists or recurs. 10 tablet 0    Pertinent medications  related to GI and procedure were reviewed by me with the patient prior to the procedure   Current Facility-Administered Medications:    0.9 %  sodium chloride  infusion, , Intravenous, Continuous, Quintin Buckle, DO, Last Rate: 20 mL/hr at 11/29/23 0925, New Bag at 11/29/23 0925  sodium chloride  20 mL/hr at 11/29/23 1914       No Known Allergies Allergies were reviewed by me prior to the procedure  Objective   Body mass index is 30.08 kg/m. Vitals:   11/29/23 0912  BP: 138/62  Pulse: 75  Temp: (!) 96.9 F (36.1 C)  TempSrc: Temporal  SpO2: 100%  Weight: 69.9 kg     Physical Exam Vitals and nursing note reviewed.  Constitutional:      General: She is not in acute distress.    Appearance: Normal appearance. She is not ill-appearing, toxic-appearing or diaphoretic.  HENT:     Head: Normocephalic and atraumatic.     Nose: Nose normal.     Mouth/Throat:     Mouth: Mucous membranes are moist.     Pharynx: Oropharynx is clear.  Eyes:     General: No scleral icterus.    Extraocular Movements: Extraocular movements intact.  Cardiovascular:     Rate and Rhythm: Normal rate and regular rhythm.     Heart sounds: Normal heart sounds. No murmur heard.    No friction rub. No gallop.  Pulmonary:     Effort: Pulmonary effort is normal. No respiratory distress.     Breath sounds: Normal breath sounds. No wheezing, rhonchi or rales.  Abdominal:     General: Bowel sounds are normal. There is no distension.     Palpations: Abdomen is soft.     Tenderness: There is no abdominal tenderness. There is no guarding or rebound.  Musculoskeletal:     Cervical back: Neck supple.     Right lower leg: No edema.     Left lower leg: No edema.  Skin:    General: Skin is warm and dry.     Coloration: Skin is not jaundiced or pale.  Neurological:     General: No focal deficit present.     Mental Status: She is alert and oriented to person, place, and time. Mental status is at baseline.   Psychiatric:        Mood and Affect: Mood normal.        Behavior: Behavior normal.        Thought Content: Thought content normal.        Judgment: Judgment normal.      Assessment:  Ms. Patricia Hamilton is a 68 y.o. female  who presents today for Esophagogastroduodenoscopy and Colonoscopy for GERD, dysphagia, rectal bleeding .  Plan:  Esophagogastroduodenoscopy and Colonoscopy with possible intervention today  Esophagogastroduodenoscopy and Colonoscopy with possible biopsy, control of bleeding, polypectomy, and interventions as necessary has been discussed with the patient/patient representative. Informed consent was obtained from the patient/patient representative after explaining the indication, nature,  and risks of the procedure including but not limited to death, bleeding, perforation, missed neoplasm/lesions, cardiorespiratory compromise, and reaction to medications. Opportunity for questions was given and appropriate answers were provided. Patient/patient representative has verbalized understanding is amenable to undergoing the procedure.   Quintin Buckle, DO  [facility]  Portions of the record may have been created with voice recognition software. Occasional wrong-word or 'sound-a-like' substitutions may have occurred due to the inherent limitations of voice recognition software.  Read the chart carefully and recognize, using context, where substitutions may have occurred.

## 2023-11-29 ENCOUNTER — Ambulatory Visit

## 2023-11-29 ENCOUNTER — Ambulatory Visit
Admission: RE | Admit: 2023-11-29 | Discharge: 2023-11-29 | Disposition: A | Discharge status: Home / Self Care | Attending: Gastroenterology | Admitting: Gastroenterology

## 2023-11-29 ENCOUNTER — Encounter: Payer: Self-pay | Admitting: Gastroenterology

## 2023-11-29 ENCOUNTER — Encounter
Admission: RE | Disposition: A | Discharge status: Home / Self Care | Payer: Self-pay | Source: Home / Self Care | Attending: Gastroenterology

## 2023-11-29 DIAGNOSIS — Z7985 Long-term (current) use of injectable non-insulin antidiabetic drugs: Secondary | ICD-10-CM | POA: Diagnosis not present

## 2023-11-29 DIAGNOSIS — K2950 Unspecified chronic gastritis without bleeding: Secondary | ICD-10-CM | POA: Insufficient documentation

## 2023-11-29 DIAGNOSIS — K5730 Diverticulosis of large intestine without perforation or abscess without bleeding: Secondary | ICD-10-CM | POA: Diagnosis not present

## 2023-11-29 DIAGNOSIS — K219 Gastro-esophageal reflux disease without esophagitis: Secondary | ICD-10-CM | POA: Insufficient documentation

## 2023-11-29 DIAGNOSIS — K625 Hemorrhage of anus and rectum: Secondary | ICD-10-CM | POA: Insufficient documentation

## 2023-11-29 DIAGNOSIS — D120 Benign neoplasm of cecum: Secondary | ICD-10-CM | POA: Insufficient documentation

## 2023-11-29 DIAGNOSIS — K31A29 Gastric intestinal metaplasia with dysplasia, unspecified: Secondary | ICD-10-CM | POA: Insufficient documentation

## 2023-11-29 DIAGNOSIS — Z800 Family history of malignant neoplasm of digestive organs: Secondary | ICD-10-CM | POA: Diagnosis not present

## 2023-11-29 DIAGNOSIS — K224 Dyskinesia of esophagus: Secondary | ICD-10-CM | POA: Insufficient documentation

## 2023-11-29 DIAGNOSIS — D128 Benign neoplasm of rectum: Secondary | ICD-10-CM | POA: Insufficient documentation

## 2023-11-29 DIAGNOSIS — K222 Esophageal obstruction: Secondary | ICD-10-CM | POA: Insufficient documentation

## 2023-11-29 DIAGNOSIS — K640 First degree hemorrhoids: Secondary | ICD-10-CM | POA: Insufficient documentation

## 2023-11-29 DIAGNOSIS — E119 Type 2 diabetes mellitus without complications: Secondary | ICD-10-CM | POA: Diagnosis not present

## 2023-11-29 HISTORY — DX: Insomnia, unspecified: G47.00

## 2023-11-29 HISTORY — DX: Headache, unspecified: R51.9

## 2023-11-29 HISTORY — DX: Diverticulosis of intestine, part unspecified, without perforation or abscess without bleeding: K57.90

## 2023-11-29 HISTORY — PX: COLONOSCOPY: SHX5424

## 2023-11-29 HISTORY — DX: Polyp of colon: K63.5

## 2023-11-29 HISTORY — PX: ESOPHAGOGASTRODUODENOSCOPY: SHX5428

## 2023-11-29 SURGERY — COLONOSCOPY
Anesthesia: General

## 2023-11-29 MED ORDER — LIDOCAINE HCL (CARDIAC) PF 100 MG/5ML IV SOSY
PREFILLED_SYRINGE | INTRAVENOUS | Status: DC | PRN
  Administered 2023-11-29: 100 mg via INTRAVENOUS

## 2023-11-29 MED ORDER — GLYCOPYRROLATE 0.2 MG/ML IJ SOLN
INTRAMUSCULAR | Status: DC | PRN
  Administered 2023-11-29: .2 mg via INTRAVENOUS

## 2023-11-29 MED ORDER — PHENYLEPHRINE 80 MCG/ML (10ML) SYRINGE FOR IV PUSH (FOR BLOOD PRESSURE SUPPORT)
PREFILLED_SYRINGE | INTRAVENOUS | Status: DC | PRN
  Administered 2023-11-29 (×2): 80 ug via INTRAVENOUS

## 2023-11-29 MED ORDER — SODIUM CHLORIDE 0.9 % IV SOLN: INTRAVENOUS | Status: DC

## 2023-11-29 MED ORDER — PROPOFOL 10 MG/ML IV BOLUS
INTRAVENOUS | Status: DC | PRN
Start: 2023-11-29 — End: 2023-11-29
  Administered 2023-11-29: 100 mg via INTRAVENOUS
  Administered 2023-11-29: 125 ug/kg/min via INTRAVENOUS

## 2023-11-29 MED ORDER — GLYCOPYRROLATE 0.2 MG/ML IJ SOLN
INTRAMUSCULAR | Status: AC
  Filled 2023-11-29: qty 1

## 2023-11-29 MED ORDER — PHENYLEPHRINE HCL (PRESSORS) 10 MG/ML IV SOLN: INTRAVENOUS | Status: DC | PRN

## 2023-11-29 MED ORDER — PROPOFOL 1000 MG/100ML IV EMUL
INTRAVENOUS | Status: AC
  Filled 2023-11-29: qty 100

## 2023-11-29 MED ORDER — PHENYLEPHRINE 80 MCG/ML (10ML) SYRINGE FOR IV PUSH (FOR BLOOD PRESSURE SUPPORT)
PREFILLED_SYRINGE | INTRAVENOUS | Status: AC
  Filled 2023-11-29: qty 10

## 2023-11-29 MED ORDER — LIDOCAINE HCL (PF) 2 % IJ SOLN
INTRAMUSCULAR | Status: AC
  Filled 2023-11-29: qty 5

## 2023-11-29 MED ORDER — PROPOFOL 10 MG/ML IV BOLUS
INTRAVENOUS | Status: AC
  Filled 2023-11-29: qty 20

## 2023-11-29 NOTE — Transfer of Care (Signed)
 Immediate Anesthesia Transfer of Care Note  Patient: Patricia Hamilton  Procedure(s) Performed: COLONOSCOPY EGD (ESOPHAGOGASTRODUODENOSCOPY)  Patient Location: PACU  Anesthesia Type:General  Level of Consciousness: awake, drowsy, and patient cooperative  Airway & Oxygen Therapy: Patient Spontanous Breathing and Patient connected to nasal cannula oxygen  Post-op Assessment: Report given to RN and Post -op Vital signs reviewed and stable  Post vital signs: Reviewed and stable  Last Vitals:  Vitals Value Taken Time  BP 110/90 11/29/23 1025  Temp 36.1 C 11/29/23 1025  Pulse 86 11/29/23 1026  Resp 16 11/29/23 1026  SpO2 100 % 11/29/23 1026  Vitals shown include unfiled device data.  Last Pain:  Vitals:   11/29/23 1025  TempSrc: Temporal  PainSc: 0-No pain       Patent airway to PACU, breathing spontaneously on 4L O2 via [place]. Patient awake but sleepy. VSS.  Complications: No notable events documented.

## 2023-11-29 NOTE — Progress Notes (Signed)
 Brief GI Post op note Patient seen in post op with her husband. Video medical spanish interpreter was used to convey information regarding procedures.  Patient notes some abdominal discomfort after the procedure after getting dressed. She states it is in the same location as it always is and is overall the same to slightly worse. Her abdomen is soft. It is ttp in one spot (not generalized). Non peritoneal. No rebound. No rigidity.  Vital signs are currently stable. Procedures were uncomplicated. I provided her with parameters to go to the emergency department including but not limited to increasing pain and fever. The patient and her husband verbalized understanding. No further questions.  Jorje Newton, DO [facility]

## 2023-11-29 NOTE — Interval H&P Note (Signed)
 History and Physical Interval Note: Preprocedure H&P from 11/29/23  was reviewed and there was no interval change after seeing and examining the patient.  Written consent was obtained from the patient after discussion of risks, benefits, and alternatives. Patient has consented to proceed with Esophagogastroduodenoscopy and Colonoscopy with possible intervention   11/29/2023 9:33 AM  Patricia Hamilton  has presented today for surgery, with the diagnosis of K62.5 (ICD-10-CM) - Rectal bleeding R13.10 (ICD-10-CM) - Dysphagia, unspecified type K21.9 (ICD-10-CM) - Gastroesophageal reflux disease, unspecified whether esophagitis present.  The various methods of treatment have been discussed with the patient and family. After consideration of risks, benefits and other options for treatment, the patient has consented to  Procedure(s) with comments: COLONOSCOPY (N/A) - SPANISH INTERPRETER EGD (ESOPHAGOGASTRODUODENOSCOPY) (N/A) as a surgical intervention.  The patient's history has been reviewed, patient examined, no change in status, stable for surgery.  I have reviewed the patient's chart and labs.  Questions were answered to the patient's satisfaction.     Quintin Buckle

## 2023-11-29 NOTE — Anesthesia Preprocedure Evaluation (Signed)
 Anesthesia Evaluation  Patient identified by MRN, date of birth, ID band Patient awake    Reviewed: Allergy & Precautions, NPO status , Patient's Chart, lab work & pertinent test results  History of Anesthesia Complications Negative for: history of anesthetic complications  Airway Mallampati: II  TM Distance: >3 FB Neck ROM: full    Dental  (+) Poor Dentition   Pulmonary neg pulmonary ROS   Pulmonary exam normal        Cardiovascular hypertension, On Medications negative cardio ROS Normal cardiovascular exam     Neuro/Psych  Headaches PSYCHIATRIC DISORDERS  Depression       GI/Hepatic negative GI ROS, Neg liver ROS,,,  Endo/Other  negative endocrine ROSdiabetes    Renal/GU negative Renal ROS  negative genitourinary   Musculoskeletal   Abdominal   Peds  Hematology negative hematology ROS (+)   Anesthesia Other Findings Past Medical History: No date: Depression No date: Diabetes mellitus without complication (HCC)     Comment:  no meds No date: Diverticulosis No date: Headache No date: Hyperlipidemia No date: Hyperplastic colon polyp No date: Hypertension No date: Insomnia  Past Surgical History: No date: ABDOMINAL SURGERY No date: COLONOSCOPY 01/21/2017: COLONOSCOPY WITH PROPOFOL ; N/A     Comment:  Procedure: COLONOSCOPY WITH PROPOFOL ;  Surgeon:               Deveron Fly, MD;  Location: [facility] ENDOSCOPY;                Service: Endoscopy;  Laterality: N/A; No date: ESOPHAGOGASTRODUODENOSCOPY No date: Laproscopic Tubal Ligation No date: TUBAL LIGATION  BMI    Body Mass Index: 30.08 kg/m      Reproductive/Obstetrics negative OB ROS                             Anesthesia Physical Anesthesia Plan  ASA: 3  Anesthesia Plan: General   Post-op Pain Management: Minimal or no pain anticipated   Induction: Intravenous  PONV Risk Score and Plan: 2 and Propofol  infusion  and TIVA  Airway Management Planned: Natural Airway and Nasal Cannula  Additional Equipment:   Intra-op Plan:   Post-operative Plan:   Informed Consent: I have reviewed the patients History and Physical, chart, labs and discussed the procedure including the risks, benefits and alternatives for the proposed anesthesia with the patient or authorized representative who has indicated his/her understanding and acceptance.     Dental Advisory Given  Plan Discussed with: Anesthesiologist, CRNA and Surgeon  Anesthesia Plan Comments: (Patient consented for risks of anesthesia including but not limited to:  - adverse reactions to medications - risk of airway placement if required - damage to eyes, teeth, lips or other oral mucosa - nerve damage due to positioning  - sore throat or hoarseness - Damage to heart, brain, nerves, lungs, other parts of body or loss of life  Patient voiced understanding and assent.)        Anesthesia Quick Evaluation

## 2023-11-29 NOTE — Anesthesia Postprocedure Evaluation (Signed)
 Anesthesia Post Note  Patient: Patricia Hamilton  Procedure(s) Performed: COLONOSCOPY EGD (ESOPHAGOGASTRODUODENOSCOPY)  Patient location during evaluation: Endoscopy Anesthesia Type: General Level of consciousness: awake and alert Pain management: pain level controlled Vital Signs Assessment: post-procedure vital signs reviewed and stable Respiratory status: spontaneous breathing, nonlabored ventilation, respiratory function stable and patient connected to nasal cannula oxygen Cardiovascular status: blood pressure returned to baseline and stable Postop Assessment: no apparent nausea or vomiting Anesthetic complications: no   No notable events documented.   Last Vitals:  Vitals:   11/29/23 1036 11/29/23 1045  BP: 120/77 131/78  Pulse: 83   Resp: 17 18  Temp:    SpO2: 100%     Last Pain:  Vitals:   11/29/23 1045  TempSrc:   PainSc: 0-No pain                 Nancey Awkward

## 2023-11-29 NOTE — Op Note (Signed)
 [facility] Patient Name: Patricia Hamilton Procedure Date: 11/29/2023 9:37 AM MRN: [id number] Account #: [id number] Date of Birth: May 25, 1956 Admit Type: Outpatient Age: 68 Room: [facility] ENDO ROOM 1 Gender: Female Note Status: Finalized Instrument Name: Colonoscope [id number] Procedure:             Colonoscopy Indications:           Rectal bleeding Providers:             Quintin Buckle DO, DO Referring MD:          No Local Md, MD (Referring MD) Medicines:             Monitored Anesthesia Care Complications:         No immediate complications. Estimated blood loss:                         Minimal. Procedure:             Pre-Anesthesia Assessment:                        - Prior to the procedure, a History and Physical was                         performed, and patient medications and allergies were                         reviewed. The patient is competent. The risks and                         benefits of the procedure and the sedation options and                         risks were discussed with the patient. All questions                         were answered and informed consent was obtained.                         Patient identification and proposed procedure were                         verified by the physician, the nurse, the anesthetist                         and the technician in the endoscopy suite. Mental                         Status Examination: alert and oriented. Airway                         Examination: normal oropharyngeal airway and neck                         mobility. Respiratory Examination: clear to                         auscultation. CV Examination: RRR, no murmurs, no S3  or S4. Prophylactic Antibiotics: The patient does not                         require prophylactic antibiotics. Prior                         Anticoagulants: The patient has taken no anticoagulant                         or  antiplatelet agents. ASA Grade Assessment: III - A                         patient with severe systemic disease. After reviewing                         the risks and benefits, the patient was deemed in                         satisfactory condition to undergo the procedure. The                         anesthesia plan was to use monitored anesthesia care                         (MAC). Immediately prior to administration of                         medications, the patient was re-assessed for adequacy                         to receive sedatives. The heart rate, respiratory                         rate, oxygen saturations, blood pressure, adequacy of                         pulmonary ventilation, and response to care were                         monitored throughout the procedure. The physical                         status of the patient was re-assessed after the                         procedure.                        After obtaining informed consent, the colonoscope was                         passed under direct vision. Throughout the procedure,                         the patient's blood pressure, pulse, and oxygen                         saturations were monitored continuously. The  Colonoscope was introduced through the anus and                         advanced to the the terminal ileum, with                         identification of the appendiceal orifice and IC                         valve. The colonoscopy was performed without                         difficulty. The patient tolerated the procedure well.                         The quality of the bowel preparation was evaluated                         using the BBPS [facility] Bowel Preparation Scale) with                         scores of: Right Colon = 3, Transverse Colon = 3 and                         Left Colon = 3 (entire mucosa seen well with no                         residual staining, small fragments  of stool or opaque                         liquid). The total BBPS score equals 9. The terminal                         ileum, ileocecal valve, appendiceal orifice, and                         rectum were photographed. Findings:      The perianal and digital rectal examinations were normal. Pertinent       negatives include normal sphincter tone.      The terminal ileum appeared normal. Estimated blood loss: none.      Retroflexion in the right colon was performed.      Multiple small-mouthed diverticula were found in the entire colon.       Estimated blood loss: none.      Non-bleeding internal hemorrhoids were found during retroflexion. The       hemorrhoids were Grade I (internal hemorrhoids that do not prolapse).       Estimated blood loss: none.      Two sessile polyps were found in the cecum. The polyps were 3 to 7 mm in       size. These polyps were removed with a cold snare. Resection and       retrieval were complete. Estimated blood loss was minimal.      Two sessile polyps were found in the rectum. The polyps were 1 to 2 mm       in size. These polyps were removed with a jumbo cold forceps. Resection       and retrieval were  complete. Estimated blood loss was minimal.      The exam was otherwise without abnormality on direct and retroflexion       views. Impression:            - The examined portion of the ileum was normal.                        - Diverticulosis in the entire examined colon.                        - Non-bleeding internal hemorrhoids.                        - Two 3 to 7 mm polyps in the cecum, removed with a                         cold snare. Resected and retrieved.                        - Two 1 to 2 mm polyps in the rectum, removed with a                         jumbo cold forceps. Resected and retrieved.                        - The examination was otherwise normal on direct and                         retroflexion views.                        -                         - La porcin examinada del leon fue normal.                        - Diverticulosis en todo el colon examinado.                        - Hemorroides internas sin sangrado.                        - Dos plipos de 3 a 7 mm en el ciego, extirpados con                         asa fra. Resecados y recuperados.                        - Dos plipos de 1 a 2 mm en el recto, extirpados con                         pinzas fras jumbo. Resecados y recuperados.                        - El resto del examen fue normal en proyecciones                         directas y en retroflexin.  Recommendation:        - Patient has a contact number available for                         emergencies. The signs and symptoms of potential                         delayed complications were discussed with the patient.                         Return to normal activities tomorrow. Written                         discharge instructions were provided to the patient.                        - Discharge patient to home.                        - Resume previous diet.                        - Continue present medications.                        - No ibuprofen , naproxen, or other non-steroidal                         anti-inflammatory drugs for 5 days after polyp removal.                        - Await pathology results.                        - Repeat colonoscopy for surveillance based on                         pathology results.                        - Return to GI clinic as previously scheduled.                        - The findings and recommendations were discussed with                         the patient. Procedure Code(s):     --- Professional ---                        [id number], Colonoscopy, flexible; with removal of                         tumor(s), polyp(s), or other lesion(s) by snare                         technique                        45380, 59, Colonoscopy, flexible; with biopsy, single  or multiple Diagnosis Code(s):     --- Professional ---                        K64.0, First degree hemorrhoids                        D12.0, Benign neoplasm of cecum                        D12.8, Benign neoplasm of rectum                        K62.5, Hemorrhage of anus and rectum                        K57.30, Diverticulosis of large intestine without                         perforation or abscess without bleeding CPT copyright 2022 American Medical Association. All rights reserved. The codes documented in this report are preliminary and upon coder review may  be revised to meet current compliance requirements. Attending Participation:      I personally performed the entire procedure. Polo Brisk, DO Quintin Buckle DO, DO 11/29/2023 10:26:55 AM This report has been signed electronically. Number of Addenda: 0 Note Initiated On: 11/29/2023 9:37 AM Scope Withdrawal Time: 0 hours 10 minutes 13 seconds  Total Procedure Duration: 0 hours 13 minutes 13 seconds  Estimated Blood Loss:  Estimated blood loss was minimal.      [facility]

## 2023-11-29 NOTE — Op Note (Addendum)
 [facility]astroenterology Patient Name: Patricia Hamilton Procedure Date: 11/29/2023 9:38 AM MRN: [id number] Account #: [id number] Date of Birth: 01-Aug-1955 Admit Type: Outpatient Age: 68 Room: [facility] ENDO ROOM 1 Gender: Female Note Status: Supervisor Override Instrument Name: Cristino Donna Endoscope [id number] Procedure:             Upper GI endoscopy Indications:           Dysphagia, Gastro-esophageal reflux disease Providers:             Quintin Buckle DO, DO Referring MD:          No Local Md, MD (Referring MD) Medicines:             Monitored Anesthesia Care Complications:         No immediate complications. Estimated blood loss:                         Minimal. Procedure:             Pre-Anesthesia Assessment:                        - Prior to the procedure, a History and Physical was                         performed, and patient medications and allergies were                         reviewed. The patient is competent. The risks and                         benefits of the procedure and the sedation options and                         risks were discussed with the patient. All questions                         were answered and informed consent was obtained.                         Patient identification and proposed procedure were                         verified by the physician, the nurse, the anesthetist                         and the technician in the endoscopy suite. Mental                         Status Examination: alert and oriented. Airway                         Examination: normal oropharyngeal airway and neck                         mobility. Respiratory Examination: clear to                         auscultation. CV Examination: RRR, no murmurs, no S3  or S4. Prophylactic Antibiotics: The patient does not                         require prophylactic antibiotics. Prior                         Anticoagulants: The patient has taken no  anticoagulant                         or antiplatelet agents. ASA Grade Assessment: III - A                         patient with severe systemic disease. After reviewing                         the risks and benefits, the patient was deemed in                         satisfactory condition to undergo the procedure. The                         anesthesia plan was to use monitored anesthesia care                         (MAC). Immediately prior to administration of                         medications, the patient was re-assessed for adequacy                         to receive sedatives. The heart rate, respiratory                         rate, oxygen saturations, blood pressure, adequacy of                         pulmonary ventilation, and response to care were                         monitored throughout the procedure. The physical                         status of the patient was re-assessed after the                         procedure.                        After obtaining informed consent, the endoscope was                         passed under direct vision. Throughout the procedure,                         the patient's blood pressure, pulse, and oxygen                         saturations were monitored continuously. The Endoscope  was introduced through the mouth, and advanced to the                         second part of duodenum. The upper GI endoscopy was                         accomplished without difficulty. The patient tolerated                         the procedure well. Findings:      The duodenal bulb, first portion of the duodenum and second portion of       the duodenum were normal. Biopsies for histology were taken with a cold       forceps for evaluation of celiac disease. Estimated blood loss was       minimal.      The entire examined stomach was normal. Biopsies were taken with a cold       forceps for Helicobacter pylori testing. Estimated  blood loss was       minimal.      The Z-line was regular. Estimated blood loss: none.      Esophagogastric landmarks were identified: the gastroesophageal junction       was found at 35 cm from the incisors.      Normal mucosa was found in the entire esophagus. Biopsies were obtained       from the proximal and distal esophagus with cold forceps for histology       of suspected eosinophilic esophagitis. Estimated blood loss was minimal.      The distal esophagus was mildly tortuous. Estimated blood loss: none.      Abnormal motility was noted in the esophagus. The cricopharyngeus was       normal. There is spasticity of the esophageal body. The distal       esophagus/lower esophageal sphincter is open.      One benign-appearing, intrinsic mild stenosis was found 35 cm from the       incisors. This stenosis measured 1.6 cm (inner diameter) x less than one       cm (in length). The stenosis was traversed. A TTS dilator was passed       through the scope. Dilation with a 15-16.5-18 mm balloon dilator was       performed to 15 mm, 16.5 mm and 18 mm. The dilation site was examined       following endoscope reinsertion and showed no change. Estimated blood       loss: none.      The exam of the esophagus was otherwise normal. Impression:            - Normal duodenal bulb, first portion of the duodenum                         and second portion of the duodenum. Biopsied.                        - Normal stomach. Biopsied.                        - Z-line regular.                        - Esophagogastric landmarks identified.                        -  Normal mucosa was found in the entire esophagus.                        - Tortuous esophagus.                        - Abnormal esophageal motility, suspicious for                         esophageal spasm.                        - Benign-appearing esophageal stenosis. Dilated.                        - Biopsies were taken with a cold forceps for                          evaluation of eosinophilic esophagitis.                        -                        - Bulbo duodenal, primera y segunda porci?n del                         duodeno normales. Biopsiada.                        - Est?mago normal. Biopsiado.                        - L?nea Z regular.                        - Referentes esofagog?stricos identificados.                        - Mucosa normal en todo el es?fago.                        - Es?fago tortuoso.                        - Motilidad esof?gica anormal, sospechosa de espasmo                         esof?gico.                        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 Dilatada.                        - Se tomaron biopsias con pinzas fr?as para evaluar                         esofagitis eosinof?lica. Recommendation:        - Patient has a contact number available for                         emergencies. The signs and symptoms of potential  delayed complications were discussed with the patient.                         Return to normal activities tomorrow. Written                         discharge instructions were provided to the patient.                        - Discharge patient to home.                        - Resume previous diet.                        - Continue present medications.                        - Await pathology results.                        - Return to GI clinic as previously scheduled.                        - proceed with colonoscopy. see report for further                         recommendations.                        - The findings and recommendations were discussed with                         the patient. Procedure Code(s):     --- Professional ---                        [phone], Esophagogastroduodenoscopy, flexible,                         transoral; with transendoscopic balloon dilation of                         esophagus (less than 30 mm diameter)                         [id number], 59, Esophagogastroduodenoscopy, flexible,                         transoral; with biopsy, single or multiple Diagnosis Code(s):     --- Professional ---                        Q39.9, Congenital malformation of esophagus,                         unspecified                        K22.4, Dyskinesia of esophagus                        K22.2, Esophageal obstruction  R13.10, Dysphagia, unspecified CPT copyright 2022 [employer]. All rights reserved. The codes documented in this report are preliminary and upon coder review may  be revised to meet current compliance requirements. Attending Participation:      I personally performed the entire procedure. Polo Brisk, DO Quintin Buckle DO, DO 11/29/2023 10:20:21 AM This report has been signed electronically. Number of Addenda: 0 Note Initiated On: 11/29/2023 9:38 AM Estimated Blood Loss:  Estimated blood loss was minimal.      [facility], [place]

## 2023-12-03 LAB — SURGICAL PATHOLOGY

## 2024-02-25 ENCOUNTER — Other Ambulatory Visit: Payer: Self-pay | Admitting: Family Medicine

## 2024-02-25 DIAGNOSIS — Z1231 Encounter for screening mammogram for malignant neoplasm of breast: Secondary | ICD-10-CM

## 2024-03-12 ENCOUNTER — Ambulatory Visit
Admission: RE | Admit: 2024-03-12 | Discharge: 2024-03-12 | Disposition: A | Discharge status: Home / Self Care | Source: Ambulatory Visit | Attending: Family Medicine | Admitting: Family Medicine

## 2024-03-12 DIAGNOSIS — Z1231 Encounter for screening mammogram for malignant neoplasm of breast: Secondary | ICD-10-CM | POA: Diagnosis present

## 2024-04-08 ENCOUNTER — Other Ambulatory Visit: Payer: Self-pay | Admitting: Nurse Practitioner

## 2024-04-08 DIAGNOSIS — Z780 Asymptomatic menopausal state: Secondary | ICD-10-CM

## 2024-05-07 ENCOUNTER — Ambulatory Visit
Admission: RE | Admit: 2024-05-07 | Discharge: 2024-05-07 | Disposition: A | Discharge status: Home / Self Care | Source: Ambulatory Visit | Attending: Nurse Practitioner | Admitting: Nurse Practitioner

## 2024-05-07 DIAGNOSIS — Z780 Asymptomatic menopausal state: Secondary | ICD-10-CM | POA: Diagnosis present
